# Patient Record
Sex: Male | Born: 1965
Health system: Southern US, Community
[De-identification: ages and names within clinical notes are randomized; demographics above are authoritative.]

## PROBLEM LIST (undated history)

## (undated) DIAGNOSIS — G4730 Sleep apnea, unspecified: Secondary | ICD-10-CM

## (undated) DIAGNOSIS — M1990 Unspecified osteoarthritis, unspecified site: Secondary | ICD-10-CM

## (undated) DIAGNOSIS — N189 Chronic kidney disease, unspecified: Secondary | ICD-10-CM

## (undated) DIAGNOSIS — E7800 Pure hypercholesterolemia, unspecified: Secondary | ICD-10-CM

## (undated) DIAGNOSIS — K219 Gastro-esophageal reflux disease without esophagitis: Secondary | ICD-10-CM

## (undated) DIAGNOSIS — I10 Essential (primary) hypertension: Secondary | ICD-10-CM

## (undated) DIAGNOSIS — F419 Anxiety disorder, unspecified: Secondary | ICD-10-CM

## (undated) HISTORY — PX: BACK SURGERY: SHX140

---

## 2005-12-19 HISTORY — PX: CYSTOSCOPY: SUR368

## 2006-07-01 ENCOUNTER — Ambulatory Visit: Payer: Self-pay | Admitting: General Practice

## 2006-07-19 ENCOUNTER — Ambulatory Visit: Payer: Self-pay | Admitting: General Practice

## 2007-11-07 DIAGNOSIS — G4730 Sleep apnea, unspecified: Secondary | ICD-10-CM | POA: Insufficient documentation

## 2008-07-21 ENCOUNTER — Ambulatory Visit: Payer: Self-pay | Admitting: Specialist

## 2010-06-04 ENCOUNTER — Ambulatory Visit: Payer: Self-pay | Admitting: Specialist

## 2012-06-01 ENCOUNTER — Ambulatory Visit: Payer: Self-pay | Admitting: General Practice

## 2012-06-01 ENCOUNTER — Emergency Department: Payer: Self-pay | Admitting: Emergency Medicine

## 2012-06-01 LAB — CBC
HCT: 45.6 % (ref 40.0–52.0)
HGB: 14.8 g/dL (ref 13.0–18.0)
MCH: 29.8 pg (ref 26.0–34.0)
MCHC: 32.4 g/dL (ref 32.0–36.0)
Platelet: 135 10*3/uL — ABNORMAL LOW (ref 150–440)
RBC: 4.97 10*6/uL (ref 4.40–5.90)
RDW: 14.8 % — ABNORMAL HIGH (ref 11.5–14.5)
WBC: 10.2 10*3/uL (ref 3.8–10.6)

## 2012-06-01 LAB — CK TOTAL AND CKMB (NOT AT ARMC): CK, Total: 181 U/L (ref 35–232)

## 2012-06-01 LAB — COMPREHENSIVE METABOLIC PANEL
Alkaline Phosphatase: 64 U/L (ref 50–136)
BUN: 15 mg/dL (ref 7–18)
Bilirubin,Total: 0.7 mg/dL (ref 0.2–1.0)
Calcium, Total: 8.8 mg/dL (ref 8.5–10.1)
EGFR (Non-African Amer.): >60
Glucose: 97 mg/dL (ref 65–99)
Osmolality: 284 (ref 275–301)
Potassium: 3.8 mmol/L (ref 3.5–5.1)
SGOT(AST): 18 U/L (ref 15–37)
SGPT (ALT): 31 U/L

## 2012-06-01 LAB — TROPONIN I
Troponin-I: <0.02 ng/mL
Troponin-I: <0.02 ng/mL

## 2012-09-08 ENCOUNTER — Emergency Department: Payer: Self-pay | Admitting: Unknown Physician Specialty

## 2012-09-08 LAB — CBC
HCT: 44.4 % (ref 40.0–52.0)
MCH: 30.3 pg (ref 26.0–34.0)
MCHC: 33.8 g/dL (ref 32.0–36.0)
MCV: 90 fL (ref 80–100)
Platelet: 164 10*3/uL (ref 150–440)
RBC: 4.95 10*6/uL (ref 4.40–5.90)
WBC: 10.4 10*3/uL (ref 3.8–10.6)

## 2012-09-08 LAB — URINALYSIS, COMPLETE
Bacteria: NONE SEEN
Bilirubin,UR: NEGATIVE
Glucose,UR: NEGATIVE mg/dL (ref 0–75)
Leukocyte Esterase: NEGATIVE
Nitrite: NEGATIVE
Ph: 6 (ref 4.5–8.0)
Specific Gravity: 1.024 (ref 1.003–1.030)
Squamous Epithelial: NONE SEEN
WBC UR: NONE SEEN /HPF (ref 0–5)

## 2012-09-08 LAB — BASIC METABOLIC PANEL
Anion Gap: 13 (ref 7–16)
BUN: 19 mg/dL — ABNORMAL HIGH (ref 7–18)
EGFR (African American): >60
EGFR (Non-African Amer.): >60

## 2012-09-08 LAB — HEPATIC FUNCTION PANEL A (ARMC)
Albumin: 4.1 g/dL (ref 3.4–5.0)
Alkaline Phosphatase: 57 U/L (ref 50–136)
Bilirubin, Direct: <0.1 mg/dL (ref 0.00–0.20)
Bilirubin,Total: 0.3 mg/dL (ref 0.2–1.0)
SGOT(AST): 20 U/L (ref 15–37)
Total Protein: 6.8 g/dL (ref 6.4–8.2)

## 2012-09-08 LAB — LIPASE, BLOOD: Lipase: 165 U/L (ref 73–393)

## 2012-10-30 ENCOUNTER — Emergency Department: Payer: Self-pay | Admitting: Emergency Medicine

## 2012-10-31 ENCOUNTER — Encounter (HOSPITAL_COMMUNITY): Payer: Self-pay | Admitting: *Deleted

## 2012-10-31 ENCOUNTER — Other Ambulatory Visit: Payer: Self-pay | Admitting: Urology

## 2012-10-31 LAB — CBC
HCT: 43.1 % (ref 40.0–52.0)
HGB: 15.5 g/dL (ref 13.0–18.0)
MCH: 32.1 pg (ref 26.0–34.0)
MCHC: 35.9 g/dL (ref 32.0–36.0)
RBC: 4.82 10*6/uL (ref 4.40–5.90)

## 2012-10-31 LAB — URINALYSIS, COMPLETE
Glucose,UR: NEGATIVE mg/dL (ref 0–75)
Leukocyte Esterase: NEGATIVE
Nitrite: NEGATIVE
Protein: NEGATIVE
RBC,UR: 96 /HPF (ref 0–5)
WBC UR: 2 /HPF (ref 0–5)

## 2012-10-31 LAB — BASIC METABOLIC PANEL
Anion Gap: 13 (ref 7–16)
BUN: 20 mg/dL — ABNORMAL HIGH (ref 7–18)
Chloride: 104 mmol/L (ref 98–107)
Creatinine: 0.94 mg/dL (ref 0.60–1.30)
EGFR (African American): >60
EGFR (Non-African Amer.): >60
Osmolality: 285 (ref 275–301)
Sodium: 140 mmol/L (ref 136–145)

## 2012-10-31 NOTE — Progress Notes (Addendum)
Called [facility] to find out what medications has there on 10/30/2012. Pt did have torodol 30 mg IV at 1145 on 10/30/12. Procedure is at 1345 on 11/01/12. This is more than 48 hours. Patient called and made aware that his procedure is still on. Pt instructed NPO after midnight. Reviewed need for laxative tonight and clear liquids afterwards. No aspirin or ibuprofen products. Pt and wife verbalize understanding.

## 2012-11-01 ENCOUNTER — Encounter (HOSPITAL_COMMUNITY)
Admission: RE | Disposition: A | Discharge status: Home / Self Care | Payer: Self-pay | Source: Ambulatory Visit | Attending: Urology

## 2012-11-01 ENCOUNTER — Ambulatory Visit (HOSPITAL_COMMUNITY): Payer: BC Managed Care – PPO

## 2012-11-01 ENCOUNTER — Ambulatory Visit (HOSPITAL_COMMUNITY)
Admission: RE | Admit: 2012-11-01 | Discharge: 2012-11-01 | Disposition: A | Discharge status: Home / Self Care | Payer: BC Managed Care – PPO | Source: Ambulatory Visit | Attending: Urology | Admitting: Urology

## 2012-11-01 DIAGNOSIS — N201 Calculus of ureter: Secondary | ICD-10-CM | POA: Insufficient documentation

## 2012-11-01 DIAGNOSIS — E785 Hyperlipidemia, unspecified: Secondary | ICD-10-CM | POA: Insufficient documentation

## 2012-11-01 DIAGNOSIS — G4730 Sleep apnea, unspecified: Secondary | ICD-10-CM | POA: Insufficient documentation

## 2012-11-01 DIAGNOSIS — I10 Essential (primary) hypertension: Secondary | ICD-10-CM | POA: Insufficient documentation

## 2012-11-01 HISTORY — DX: Anxiety disorder, unspecified: F41.9

## 2012-11-01 HISTORY — DX: Essential (primary) hypertension: I10

## 2012-11-01 HISTORY — DX: Pure hypercholesterolemia, unspecified: E78.00

## 2012-11-01 HISTORY — DX: Sleep apnea, unspecified: G47.30

## 2012-11-01 HISTORY — DX: Chronic kidney disease, unspecified: N18.9

## 2012-11-01 SURGERY — LITHOTRIPSY, ESWL
Anesthesia: LOCAL | Laterality: Left

## 2012-11-01 MED ORDER — SODIUM CHLORIDE 0.9 % IJ SOLN: 3.0000 mL | Freq: Two times a day (BID) | INTRAMUSCULAR | Status: DC

## 2012-11-01 MED ORDER — OXYCODONE-ACETAMINOPHEN 5-325 MG PO TABS: 1.0000 | ORAL_TABLET | Freq: Four times a day (QID) | ORAL | Status: DC | PRN

## 2012-11-01 MED ORDER — ACETAMINOPHEN 650 MG RE SUPP
650.0000 mg | RECTAL | Status: DC | PRN
  Filled 2012-11-01: qty 1

## 2012-11-01 MED ORDER — FENTANYL CITRATE 0.05 MG/ML IJ SOLN: 25.0000 ug | INTRAMUSCULAR | Status: DC | PRN

## 2012-11-01 MED ORDER — SODIUM CHLORIDE 0.9 % IV SOLN: 250.0000 mL | INTRAVENOUS | Status: DC | PRN

## 2012-11-01 MED ORDER — OXYCODONE-ACETAMINOPHEN 5-325 MG PO TABS: 2.0000 | ORAL_TABLET | Freq: Four times a day (QID) | ORAL | Status: DC | PRN

## 2012-11-01 MED ORDER — HYDROMORPHONE BOLUS VIA INFUSION
1.0000 mg | INTRAVENOUS | Status: DC | PRN
  Filled 2012-11-01: qty 1

## 2012-11-01 MED ORDER — OXYCODONE HCL 5 MG PO TABS
5.0000 mg | ORAL_TABLET | ORAL | Status: DC | PRN
  Administered 2012-11-01: 10 mg via ORAL
  Filled 2012-11-01: qty 2

## 2012-11-01 MED ORDER — CIPROFLOXACIN HCL 500 MG PO TABS
500.0000 mg | ORAL_TABLET | ORAL | Status: AC
  Administered 2012-11-01: 500 mg via ORAL
  Filled 2012-11-01: qty 1

## 2012-11-01 MED ORDER — HYDROMORPHONE HCL PF 1 MG/ML IJ SOLN
1.0000 mg | INTRAMUSCULAR | Status: DC | PRN
  Administered 2012-11-01: 1 mg via INTRAVENOUS
  Filled 2012-11-01: qty 1

## 2012-11-01 MED ORDER — DIAZEPAM 5 MG PO TABS
10.0000 mg | ORAL_TABLET | ORAL | Status: AC
  Administered 2012-11-01: 10 mg via ORAL
  Filled 2012-11-01: qty 2

## 2012-11-01 MED ORDER — SODIUM CHLORIDE 0.9 % IJ SOLN: 3.0000 mL | INTRAMUSCULAR | Status: DC | PRN

## 2012-11-01 MED ORDER — DIPHENHYDRAMINE HCL 25 MG PO CAPS
25.0000 mg | ORAL_CAPSULE | ORAL | Status: AC
  Administered 2012-11-01: 25 mg via ORAL
  Filled 2012-11-01: qty 1

## 2012-11-01 MED ORDER — DEXTROSE-NACL 5-0.45 % IV SOLN
INTRAVENOUS | Status: DC
  Administered 2012-11-01: 13:00:00 via INTRAVENOUS

## 2012-11-01 MED ORDER — ACETAMINOPHEN 325 MG PO TABS: 650.0000 mg | ORAL_TABLET | ORAL | Status: DC | PRN

## 2012-11-01 MED ORDER — ONDANSETRON HCL 4 MG/2ML IJ SOLN: 4.0000 mg | Freq: Four times a day (QID) | INTRAMUSCULAR | Status: DC | PRN

## 2012-11-01 NOTE — H&P (Signed)
ctive Problems Problems  1. Ureteral Stone Left 592.1  History of Present Illness  Nicholas Greene is a 46yo WM who was sent from [facility] for a left ureteral stone.  He was seen in the ER about 2 months ago and a CT showed a left ureteral and renal stone.  He  subsequently passed one stone, but had an episode of pain about 3 days ago.  The pain resolved with a pain pill, but last night the pain recurred and was severe.  He was seen in the ER where an US showed left hydronephrosis.   He has had to continue the pain med but hasn't had any nausea.  He has had no voiding complaints and no hematuria.  His UA had blood and CaOx crystals.  He had stones since he was 16 and he had ureteroscopy in 2007.   Past Medical History Problems  1. History of  Adult Sleep Apnea 780.57 2. History of  Hyperlipidemia 272.4 3. History of  Hypertension 401.9 4. History of  Nephrolithiasis V13.01  Surgical History Problems  1. History of  Cystoscopy With Ureteroscopy With Removal Of Calculus  Current Meds 1. Percocet TABS; Therapy: (Recorded:13Nov2013) to  Allergies Medication  1. Cortisone POWD  Family History Problems  1. Maternal history of  Death In The Family Father Died age 44 unknown causes 2. Maternal history of  Death In The Family Mother Died age 81-Lung cancer 3. Maternal history of  Nephrolithiasis  Social History Problems    Being A Social Drinker   Caffeine Use 4 per day   Marital History - Currently Married   Occupation: Merchandiser, retail   Tobacco Use 305.1 1 ppd for 30 yrs  Review of Systems Genitourinary, constitutional, skin, eye, otolaryngeal, hematologic/lymphatic, cardiovascular, pulmonary, endocrine, musculoskeletal, gastrointestinal, neurological and psychiatric system(s) were reviewed and pertinent findings if present are noted.  Genitourinary: erectile dysfunction.  Gastrointestinal: flank pain.    Vitals Vital Signs [Data Includes: Last 1 Day]  13Nov2013 01:52PM  BMI  Calculated: 41.79 BSA Calculated: 2.24 Height: 5 ft 6 in Weight: 260 lb  Blood Pressure: 108 / 70 Temperature: 97.1 F Heart Rate: 76  Physical Exam Constitutional: Well nourished and well developed . No acute distress.  ENT:. The ears and nose are normal in appearance.  Neck: The appearance of the neck is normal and no neck mass is present.  Pulmonary: No respiratory distress and normal respiratory rhythm and effort.  Cardiovascular: Heart rate and rhythm are normal . No peripheral edema.  Abdomen: The abdomen is obese. The abdomen is soft and nontender. No masses are palpated. No CVA tenderness. No hernias are palpable. No hepatosplenomegaly noted.  Lymphatics: The supraclavicular nodes are not enlarged or tender.  Skin: Normal skin turgor, no visible rash and no visible skin lesions.  Neuro/Psych:. Mood and affect are appropriate.    Results/Data Urine [Data Includes: Last 1 Day]   13Nov2013  COLOR YELLOW   APPEARANCE CLEAR   SPECIFIC GRAVITY 1.025   pH 5.5   GLUCOSE NEG mg/dL  BILIRUBIN NEG   KETONE NEG mg/dL  BLOOD TRACE   PROTEIN NEG mg/dL  UROBILINOGEN 0.2 mg/dL  NITRITE NEG   LEUKOCYTE ESTERASE NEG   SQUAMOUS EPITHELIAL/HPF FEW   WBC 0-2 WBC/hpf  RBC 0-2 RBC/hpf  BACTERIA NONE SEEN   CRYSTALS Calcium Oxalate crystals noted   CASTS NONE SEEN   Other MUCUS PRESENT    I have reviewed his ER notes, labs and renal US report from [facility] ER.  The  following images/tracing/specimen were independently visualized:  KUB today shows a 6x67mm stone at about L3. No other stones are noted. He has a left phlebolith. No other abnormalities are noted.    Assessment Assessed  1. Ureteral Stone Left 592.1   He has a 6x53mm left proximal stone with pain and obstruction.   Plan Health Maintenance (V70.0)  1. UA With REFLEX  Done: 13Nov2013 12:29PM Ureteral Stone (592.1)  2. KUB  Done: 13Nov2013 12:00AM 3. Follow-up Schedule Surgery Office  Follow-up  Requested for: 13Nov2013   I  reviewed the treatment options including ESWL and ureteroscopy.  He has had ureteroscopy before and would prefer not to go that route. I reviewed the risks of both procedures including bleeding, infection, obstructing fragments, need for secondary procedures, injury to the kidney or adjacent organs, thrombotic events and anesthetic risks.  He has sufficient pain med on hand. I will try to get him on for ESWL tomorrow.

## 2012-11-01 NOTE — Interval H&P Note (Signed)
History and Physical Interval Note:  11/01/2012 1:51 PM  Nicholas Greene  has presented today for surgery, with the diagnosis of left proximal stone  The various methods of treatment have been discussed with the patient and family. After consideration of risks, benefits and other options for treatment, the patient has consented to  Procedure(s) (LRB) with comments: EXTRACORPOREAL SHOCK WAVE LITHOTRIPSY (ESWL) (Left) as a surgical intervention .  The patient's history has been reviewed, patient examined, no change in status, stable for surgery.  I have reviewed the patient's chart and labs.  Questions were answered to the patient's satisfaction.     Breonia Kirstein J

## 2013-01-21 ENCOUNTER — Ambulatory Visit: Payer: Self-pay | Admitting: General Practice

## 2013-03-14 ENCOUNTER — Inpatient Hospital Stay: Payer: Self-pay | Admitting: Internal Medicine

## 2013-03-14 LAB — URINALYSIS, COMPLETE
Bilirubin,UR: NEGATIVE
Glucose,UR: 50 mg/dL (ref 0–75)
Ketone: NEGATIVE
Leukocyte Esterase: NEGATIVE
Protein: 30
RBC,UR: 1 /HPF (ref 0–5)
Specific Gravity: 1.019 (ref 1.003–1.030)

## 2013-03-14 LAB — DRUG SCREEN, URINE
Barbiturates, Ur Screen: NEGATIVE (ref ?–200)
Benzodiazepine, Ur Scrn: POSITIVE (ref ?–200)
Cannabinoid 50 Ng, Ur ~~LOC~~: NEGATIVE (ref ?–50)
MDMA (Ecstasy)Ur Screen: NEGATIVE (ref ?–500)
Methadone, Ur Screen: NEGATIVE (ref ?–300)
Opiate, Ur Screen: NEGATIVE (ref ?–300)

## 2013-03-14 LAB — CBC WITH DIFFERENTIAL/PLATELET
HCT: 46.2 % (ref 40.0–52.0)
HGB: 16 g/dL (ref 13.0–18.0)
Lymphocyte %: 32.6 %
MCHC: 34.5 g/dL (ref 32.0–36.0)
Neutrophil %: 66.3 %
Platelet: 107 10*3/uL — ABNORMAL LOW (ref 150–440)
RBC: 5.16 10*6/uL (ref 4.40–5.90)
RDW: 14.5 % (ref 11.5–14.5)
WBC: 2.5 10*3/uL — ABNORMAL LOW (ref 3.8–10.6)

## 2013-03-14 LAB — COMPREHENSIVE METABOLIC PANEL
Albumin: 3.8 g/dL (ref 3.4–5.0)
Anion Gap: 8 (ref 7–16)
Calcium, Total: 8.5 mg/dL (ref 8.5–10.1)
Chloride: 108 mmol/L — ABNORMAL HIGH (ref 98–107)
EGFR (African American): >60
SGPT (ALT): 81 U/L — ABNORMAL HIGH (ref 12–78)
Sodium: 141 mmol/L (ref 136–145)
Total Protein: 7 g/dL (ref 6.4–8.2)

## 2013-03-14 LAB — RAPID INFLUENZA A&B ANTIGENS

## 2013-03-15 LAB — CBC WITH DIFFERENTIAL/PLATELET
Eosinophil #: 0.3 10*3/uL (ref 0.0–0.7)
Lymphocyte #: 1.3 10*3/uL (ref 1.0–3.6)
Lymphocyte %: 21.3 %
MCHC: 35.4 g/dL (ref 32.0–36.0)
Monocyte %: 5.6 %
Neutrophil #: 4.1 10*3/uL (ref 1.4–6.5)
Neutrophil %: 67.7 %
RBC: 4.55 10*6/uL (ref 4.40–5.90)
RDW: 14.7 % — ABNORMAL HIGH (ref 11.5–14.5)

## 2013-03-15 LAB — BASIC METABOLIC PANEL
Anion Gap: 5 — ABNORMAL LOW (ref 7–16)
BUN: 15 mg/dL (ref 7–18)
Calcium, Total: 8.1 mg/dL — ABNORMAL LOW (ref 8.5–10.1)
Chloride: 107 mmol/L (ref 98–107)
Co2: 25 mmol/L (ref 21–32)
EGFR (Non-African Amer.): >60
Glucose: 208 mg/dL — ABNORMAL HIGH (ref 65–99)
Potassium: 3.9 mmol/L (ref 3.5–5.1)
Sodium: 137 mmol/L (ref 136–145)

## 2013-03-15 LAB — HEMOGLOBIN A1C: Hemoglobin A1C: 7.8 % — ABNORMAL HIGH (ref 4.2–6.3)

## 2013-03-15 LAB — TROPONIN I: Troponin-I: <0.02 ng/mL

## 2013-03-20 LAB — CULTURE, BLOOD (SINGLE)

## 2014-01-07 ENCOUNTER — Ambulatory Visit: Payer: Self-pay | Admitting: General Practice

## 2014-07-04 ENCOUNTER — Other Ambulatory Visit: Payer: Self-pay | Admitting: Neurosurgery

## 2014-07-04 DIAGNOSIS — M47816 Spondylosis without myelopathy or radiculopathy, lumbar region: Secondary | ICD-10-CM

## 2014-07-09 ENCOUNTER — Ambulatory Visit
Admission: RE | Admit: 2014-07-09 | Discharge: 2014-07-09 | Disposition: A | Discharge status: Home / Self Care | Payer: BC Managed Care – PPO | Source: Ambulatory Visit | Attending: Neurosurgery | Admitting: Neurosurgery

## 2014-07-09 VITALS — BP 123/69 | HR 66

## 2014-07-09 DIAGNOSIS — M47816 Spondylosis without myelopathy or radiculopathy, lumbar region: Secondary | ICD-10-CM

## 2014-07-09 MED ORDER — ONDANSETRON HCL 4 MG/2ML IJ SOLN
4.0000 mg | Freq: Once | INTRAMUSCULAR | Status: AC
Start: 2014-07-09 — End: 2014-07-09
  Administered 2014-07-09: 4 mg via INTRAMUSCULAR

## 2014-07-09 MED ORDER — HYDROMORPHONE HCL PF 2 MG/ML IJ SOLN
2.0000 mg | Freq: Once | INTRAMUSCULAR | Status: AC
  Administered 2014-07-09: 2 mg via INTRAMUSCULAR

## 2014-07-09 MED ORDER — DIAZEPAM 5 MG PO TABS
10.0000 mg | ORAL_TABLET | Freq: Once | ORAL | Status: AC
  Administered 2014-07-09: 10 mg via ORAL

## 2014-07-09 MED ORDER — IOHEXOL 180 MG/ML  SOLN
15.0000 mL | Freq: Once | INTRAMUSCULAR | Status: AC | PRN
  Administered 2014-07-09: 15 mL via INTRATHECAL

## 2014-07-09 NOTE — Discharge Instructions (Signed)

## 2014-07-25 ENCOUNTER — Other Ambulatory Visit: Payer: Self-pay | Admitting: Neurosurgery

## 2014-07-29 ENCOUNTER — Encounter (HOSPITAL_COMMUNITY): Payer: Self-pay | Admitting: Pharmacy Technician

## 2014-08-04 ENCOUNTER — Encounter (HOSPITAL_COMMUNITY): Payer: Self-pay

## 2014-08-04 ENCOUNTER — Encounter (HOSPITAL_COMMUNITY)
Admission: RE | Admit: 2014-08-04 | Discharge: 2014-08-04 | Disposition: A | Discharge status: Home / Self Care | Payer: BC Managed Care – PPO | Source: Ambulatory Visit | Attending: Neurosurgery | Admitting: Neurosurgery

## 2014-08-04 ENCOUNTER — Ambulatory Visit (HOSPITAL_COMMUNITY)
Admission: RE | Admit: 2014-08-04 | Discharge: 2014-08-04 | Disposition: A | Discharge status: Home / Self Care | Payer: BC Managed Care – PPO | Source: Ambulatory Visit | Attending: Anesthesiology | Admitting: Anesthesiology

## 2014-08-04 DIAGNOSIS — F17200 Nicotine dependence, unspecified, uncomplicated: Secondary | ICD-10-CM | POA: Diagnosis not present

## 2014-08-04 DIAGNOSIS — N189 Chronic kidney disease, unspecified: Secondary | ICD-10-CM | POA: Diagnosis not present

## 2014-08-04 DIAGNOSIS — E119 Type 2 diabetes mellitus without complications: Secondary | ICD-10-CM | POA: Insufficient documentation

## 2014-08-04 DIAGNOSIS — E785 Hyperlipidemia, unspecified: Secondary | ICD-10-CM | POA: Diagnosis not present

## 2014-08-04 DIAGNOSIS — I10 Essential (primary) hypertension: Secondary | ICD-10-CM | POA: Diagnosis not present

## 2014-08-04 DIAGNOSIS — K219 Gastro-esophageal reflux disease without esophagitis: Secondary | ICD-10-CM | POA: Diagnosis not present

## 2014-08-04 DIAGNOSIS — I129 Hypertensive chronic kidney disease with stage 1 through stage 4 chronic kidney disease, or unspecified chronic kidney disease: Secondary | ICD-10-CM | POA: Insufficient documentation

## 2014-08-04 DIAGNOSIS — Z01818 Encounter for other preprocedural examination: Secondary | ICD-10-CM | POA: Diagnosis not present

## 2014-08-04 HISTORY — DX: Gastro-esophageal reflux disease without esophagitis: K21.9

## 2014-08-04 LAB — CBC WITH DIFFERENTIAL/PLATELET
Basophils Absolute: 0 10*3/uL (ref 0.0–0.1)
Basophils Relative: 0 % (ref 0–1)
Eosinophils Absolute: 0.2 10*3/uL (ref 0.0–0.7)
Eosinophils Relative: 2 % (ref 0–5)
HEMATOCRIT: 46.3 % (ref 39.0–52.0)
Hemoglobin: 16.3 g/dL (ref 13.0–17.0)
LYMPHS ABS: 3.1 10*3/uL (ref 0.7–4.0)
Lymphocytes Relative: 37 % (ref 12–46)
MCH: 30.7 pg (ref 26.0–34.0)
MCHC: 35.2 g/dL (ref 30.0–36.0)
MCV: 87.2 fL (ref 78.0–100.0)
MONO ABS: 0.5 10*3/uL (ref 0.1–1.0)
MONOS PCT: 6 % (ref 3–12)
Neutro Abs: 4.5 10*3/uL (ref 1.7–7.7)
Neutrophils Relative %: 55 % (ref 43–77)
Platelets: 170 10*3/uL (ref 150–400)
RBC: 5.31 MIL/uL (ref 4.22–5.81)
RDW: 13.8 % (ref 11.5–15.5)
WBC: 8.3 10*3/uL (ref 4.0–10.5)

## 2014-08-04 LAB — BASIC METABOLIC PANEL
Anion gap: 16 — ABNORMAL HIGH (ref 5–15)
BUN: 13 mg/dL (ref 6–23)
CALCIUM: 9.9 mg/dL (ref 8.4–10.5)
CO2: 24 mEq/L (ref 19–32)
CREATININE: 0.74 mg/dL (ref 0.50–1.35)
Chloride: 102 mEq/L (ref 96–112)
GFR calc Af Amer: >90 mL/min (ref >90)
GLUCOSE: 253 mg/dL — AB (ref 70–99)
POTASSIUM: 5.5 meq/L — AB (ref 3.7–5.3)
Sodium: 142 mEq/L (ref 137–147)

## 2014-08-04 LAB — SURGICAL PCR SCREEN
MRSA, PCR: NEGATIVE
Staphylococcus aureus: NEGATIVE

## 2014-08-04 NOTE — Pre-Procedure Instructions (Signed)
[place].  08/04/2014   Your procedure is scheduled on:  Mon, Aug 24 @ 12:00 PM  Report to [facility]  at 10:00 AM.  Call this number if you have problems the morning of surgery: [phone]   Remember:   Do not eat food or drink liquids after midnight.   Take these medicines the morning of surgery with A SIP OF WATER: Valium(Diazepam)               No Goody's,BC's,Aleve,Aspirin,Ibuprofen,Fish Oil,or any Herbal Medications   Do not wear jewelry  Do not wear lotions, powders, or colognes. You may wear deodorant.  Men may shave face and neck.  Do not bring valuables to the hospital.  [facility] is not responsible                  for any belongings or valuables.               Contacts, dentures or bridgework may not be worn into surgery.  Leave suitcase in the car. After surgery it may be brought to your room.  For patients admitted to the hospital, discharge time is determined by your                treatment team.                 Special Instructions:  [place] - Preparing for Surgery  Before surgery, you can play an important role.  Because skin is not sterile, your skin needs to be as free of germs as possible.  You can reduce the number of germs on you skin by washing with CHG (chlorahexidine gluconate) soap before surgery.  CHG is an antiseptic cleaner which kills germs and bonds with the skin to continue killing germs even after washing.  Please DO NOT use if you have an allergy to CHG or antibacterial soaps.  If your skin becomes reddened/irritated stop using the CHG and inform your nurse when you arrive at Short Stay.  Do not shave (including legs and underarms) for at least 48 hours prior to the first CHG shower.  You may shave your face.  Please follow these instructions carefully:   1.  Shower with CHG Soap the night before surgery and the                                morning of Surgery.  2.  If you choose to wash your hair, wash your hair  first as usual with your       normal shampoo.  3.  After you shampoo, rinse your hair and body thoroughly to remove the                      Shampoo.  4.  Use CHG as you would any other liquid soap.  You can apply chg directly       to the skin and wash gently with scrungie or a clean washcloth.  5.  Apply the CHG Soap to your body ONLY FROM THE NECK DOWN.        Do not use on open wounds or open sores.  Avoid contact with your eyes,       ears, mouth and genitals (private parts).  Wash genitals (private parts)       with your normal soap.  6.  Wash thoroughly, paying special  attention to the area where your surgery        will be performed.  7.  Thoroughly rinse your body with warm water from the neck down.  8.  DO NOT shower/wash with your normal soap after using and rinsing off       the CHG Soap.  9.  Pat yourself dry with a clean towel.            10.  Wear clean pajamas.            11.  Place clean sheets on your bed the night of your first shower and do not        sleep with pets.  Day of Surgery  Do not apply any lotions/deoderants the morning of surgery.  Please wear clean clothes to the hospital/surgery center.     Please read over the following fact sheets that you were given: Pain Booklet, Coughing and Deep Breathing, Blood Transfusion Information, MRSA Information and Surgical Site Infection Prevention

## 2014-08-04 NOTE — Progress Notes (Addendum)
Patient has OSA, sleeps with CPAP, was tested 10-12 yrs ago over [place].  He thinks the settings are at 8 and he will bring the mask with him day of surgery. Does not want to wear the blood band x 7 days.  He understands he will have sample drawn DOS. Denies any heart problems or has never seen a cardiologist.

## 2014-08-05 NOTE — Progress Notes (Signed)
Anesthesia Chart Review:  Pt is 48 year old male posted for L5-S1 posterior lumbar interbody fusion.   PMH: HTN, diabetes, hyperlipidemia, GERD, OSA, chronic kidney disease. Current smoker. BMI 41.36  Preoperative labs reviewed. Blood glucose is 253. K is 5.5, likely due to hemolysis.   Cxr reviewed.   EKG NSR.   Will get DOS istat to recheck glucose in fasting state and K.   Willeen Cass, FNP-BC [facility]/Anesthesiology Phone: [phone] 08/05/2014 3:53 PM

## 2014-08-11 ENCOUNTER — Inpatient Hospital Stay (HOSPITAL_COMMUNITY): Payer: BC Managed Care – PPO | Admitting: Certified Registered"

## 2014-08-11 ENCOUNTER — Inpatient Hospital Stay (HOSPITAL_COMMUNITY): Payer: BC Managed Care – PPO

## 2014-08-11 ENCOUNTER — Encounter (HOSPITAL_COMMUNITY): Payer: BC Managed Care – PPO | Admitting: Emergency Medicine

## 2014-08-11 ENCOUNTER — Encounter (HOSPITAL_COMMUNITY)
Admission: RE | Disposition: A | Discharge status: Home / Self Care | Payer: Self-pay | Source: Ambulatory Visit | Attending: Neurosurgery

## 2014-08-11 ENCOUNTER — Inpatient Hospital Stay (HOSPITAL_COMMUNITY)
Admission: RE | Admit: 2014-08-11 | Discharge: 2014-08-13 | DRG: 460 | Disposition: A | Discharge status: Home / Self Care | Payer: BC Managed Care – PPO | Source: Ambulatory Visit | Attending: Neurosurgery | Admitting: Neurosurgery

## 2014-08-11 ENCOUNTER — Encounter (HOSPITAL_COMMUNITY): Payer: Self-pay | Admitting: Anesthesiology

## 2014-08-11 DIAGNOSIS — M47816 Spondylosis without myelopathy or radiculopathy, lumbar region: Secondary | ICD-10-CM | POA: Diagnosis present

## 2014-08-11 DIAGNOSIS — M47817 Spondylosis without myelopathy or radiculopathy, lumbosacral region: Principal | ICD-10-CM | POA: Diagnosis present

## 2014-08-11 DIAGNOSIS — N189 Chronic kidney disease, unspecified: Secondary | ICD-10-CM | POA: Diagnosis present

## 2014-08-11 DIAGNOSIS — F411 Generalized anxiety disorder: Secondary | ICD-10-CM | POA: Diagnosis present

## 2014-08-11 DIAGNOSIS — G4730 Sleep apnea, unspecified: Secondary | ICD-10-CM | POA: Diagnosis present

## 2014-08-11 DIAGNOSIS — I129 Hypertensive chronic kidney disease with stage 1 through stage 4 chronic kidney disease, or unspecified chronic kidney disease: Secondary | ICD-10-CM | POA: Diagnosis present

## 2014-08-11 DIAGNOSIS — M545 Low back pain, unspecified: Secondary | ICD-10-CM | POA: Diagnosis present

## 2014-08-11 DIAGNOSIS — F17200 Nicotine dependence, unspecified, uncomplicated: Secondary | ICD-10-CM | POA: Diagnosis present

## 2014-08-11 DIAGNOSIS — K219 Gastro-esophageal reflux disease without esophagitis: Secondary | ICD-10-CM | POA: Diagnosis present

## 2014-08-11 HISTORY — PX: MAXIMUM ACCESS (MAS)POSTERIOR LUMBAR INTERBODY FUSION (PLIF) 1 LEVEL: SHX6368

## 2014-08-11 LAB — POCT I-STAT 4, (NA,K, GLUC, HGB,HCT)
Glucose, Bld: 205 mg/dL — ABNORMAL HIGH (ref 70–99)
HCT: 47 % (ref 39.0–52.0)
HEMOGLOBIN: 16 g/dL (ref 13.0–17.0)
Potassium: 4.7 mEq/L (ref 3.7–5.3)
SODIUM: 137 meq/L (ref 137–147)

## 2014-08-11 LAB — ABO/RH: ABO/RH(D): O NEG

## 2014-08-11 LAB — TYPE AND SCREEN
ABO/RH(D): O NEG
ANTIBODY SCREEN: NEGATIVE

## 2014-08-11 SURGERY — FOR MAXIMUM ACCESS (MAS) POSTERIOR LUMBAR INTERBODY FUSION (PLIF) 1 LEVEL
Anesthesia: General

## 2014-08-11 MED ORDER — ONDANSETRON HCL 4 MG/2ML IJ SOLN
INTRAMUSCULAR | Status: AC
  Filled 2014-08-11: qty 2

## 2014-08-11 MED ORDER — HYDROMORPHONE HCL PF 1 MG/ML IJ SOLN: 0.2500 mg | INTRAMUSCULAR | Status: DC | PRN

## 2014-08-11 MED ORDER — ALUM & MAG HYDROXIDE-SIMETH 200-200-20 MG/5ML PO SUSP: 30.0000 mL | Freq: Four times a day (QID) | ORAL | Status: DC | PRN

## 2014-08-11 MED ORDER — HEMOSTATIC AGENTS (NO CHARGE) OPTIME
TOPICAL | Status: DC | PRN
  Administered 2014-08-11: 1 via TOPICAL

## 2014-08-11 MED ORDER — FENTANYL CITRATE 0.05 MG/ML IJ SOLN
INTRAMUSCULAR | Status: DC | PRN
  Administered 2014-08-11: 50 ug via INTRAVENOUS
  Administered 2014-08-11: 250 ug via INTRAVENOUS
  Administered 2014-08-11: 100 ug via INTRAVENOUS
  Administered 2014-08-11: 150 ug via INTRAVENOUS
  Administered 2014-08-11: 50 ug via INTRAVENOUS
  Administered 2014-08-11: 100 ug via INTRAVENOUS
  Administered 2014-08-11: 50 ug via INTRAVENOUS

## 2014-08-11 MED ORDER — FLEET ENEMA 7-19 GM/118ML RE ENEM
1.0000 | ENEMA | Freq: Once | RECTAL | Status: AC | PRN
  Filled 2014-08-11: qty 1

## 2014-08-11 MED ORDER — PHENOL 1.4 % MT LIQD: 1.0000 | OROMUCOSAL | Status: DC | PRN

## 2014-08-11 MED ORDER — PROPOFOL 10 MG/ML IV BOLUS
INTRAVENOUS | Status: AC
  Filled 2014-08-11: qty 20

## 2014-08-11 MED ORDER — ARTIFICIAL TEARS OP OINT
TOPICAL_OINTMENT | OPHTHALMIC | Status: AC
  Filled 2014-08-11: qty 3.5

## 2014-08-11 MED ORDER — FENTANYL CITRATE 0.05 MG/ML IJ SOLN
INTRAMUSCULAR | Status: AC
Start: 2014-08-11 — End: 2014-08-11
  Filled 2014-08-11: qty 5

## 2014-08-11 MED ORDER — FENTANYL CITRATE 0.05 MG/ML IJ SOLN
INTRAMUSCULAR | Status: AC
  Filled 2014-08-11: qty 5

## 2014-08-11 MED ORDER — LIDOCAINE HCL (CARDIAC) 20 MG/ML IV SOLN
INTRAVENOUS | Status: DC | PRN
  Administered 2014-08-11: 100 mg via INTRAVENOUS

## 2014-08-11 MED ORDER — SUCCINYLCHOLINE CHLORIDE 20 MG/ML IJ SOLN
INTRAMUSCULAR | Status: DC | PRN
  Administered 2014-08-11: 140 mg via INTRAVENOUS

## 2014-08-11 MED ORDER — METFORMIN HCL 500 MG PO TABS
1000.0000 mg | ORAL_TABLET | Freq: Every day | ORAL | Status: DC
  Administered 2014-08-12 – 2014-08-13 (×2): 1000 mg via ORAL
  Filled 2014-08-11 (×3): qty 2

## 2014-08-11 MED ORDER — POLYETHYLENE GLYCOL 3350 17 G PO PACK
17.0000 g | PACK | Freq: Every day | ORAL | Status: DC | PRN
  Filled 2014-08-11: qty 1

## 2014-08-11 MED ORDER — ACETAMINOPHEN 325 MG PO TABS: 650.0000 mg | ORAL_TABLET | ORAL | Status: DC | PRN

## 2014-08-11 MED ORDER — DIAZEPAM 5 MG PO TABS
5.0000 mg | ORAL_TABLET | Freq: Four times a day (QID) | ORAL | Status: DC | PRN
  Administered 2014-08-11 – 2014-08-13 (×6): 10 mg via ORAL
  Filled 2014-08-11 (×6): qty 2

## 2014-08-11 MED ORDER — MENTHOL 3 MG MT LOZG: 1.0000 | LOZENGE | OROMUCOSAL | Status: DC | PRN

## 2014-08-11 MED ORDER — OXYCODONE-ACETAMINOPHEN 5-325 MG PO TABS
1.0000 | ORAL_TABLET | ORAL | Status: DC | PRN
Start: 2014-08-11 — End: 2014-08-13
  Administered 2014-08-11 – 2014-08-12 (×3): 2 via ORAL
  Filled 2014-08-11 (×3): qty 2

## 2014-08-11 MED ORDER — BISOPROLOL FUMARATE 5 MG PO TABS
5.0000 mg | ORAL_TABLET | Freq: Every day | ORAL | Status: DC
  Administered 2014-08-12: 5 mg via ORAL
  Filled 2014-08-11 (×2): qty 1

## 2014-08-11 MED ORDER — THROMBIN 5000 UNITS EX SOLR
CUTANEOUS | Status: DC | PRN
  Administered 2014-08-11 (×2): 5000 [IU] via TOPICAL

## 2014-08-11 MED ORDER — NEOSTIGMINE METHYLSULFATE 10 MG/10ML IV SOLN
INTRAVENOUS | Status: AC
  Filled 2014-08-11: qty 1

## 2014-08-11 MED ORDER — ROCURONIUM BROMIDE 50 MG/5ML IV SOLN
INTRAVENOUS | Status: AC
  Filled 2014-08-11: qty 1

## 2014-08-11 MED ORDER — LIDOCAINE HCL (CARDIAC) 20 MG/ML IV SOLN
INTRAVENOUS | Status: AC
  Filled 2014-08-11: qty 10

## 2014-08-11 MED ORDER — SUCCINYLCHOLINE CHLORIDE 20 MG/ML IJ SOLN
INTRAMUSCULAR | Status: AC
  Filled 2014-08-11: qty 1

## 2014-08-11 MED ORDER — HYDROMORPHONE HCL PF 1 MG/ML IJ SOLN
INTRAMUSCULAR | Status: AC
  Filled 2014-08-11: qty 1

## 2014-08-11 MED ORDER — LIDOCAINE HCL 4 % MT SOLN
OROMUCOSAL | Status: DC | PRN
Start: 2014-08-11 — End: 2014-08-11
  Administered 2014-08-11: 4 mL via TOPICAL

## 2014-08-11 MED ORDER — ONDANSETRON HCL 4 MG/2ML IJ SOLN: 4.0000 mg | Freq: Once | INTRAMUSCULAR | Status: DC | PRN

## 2014-08-11 MED ORDER — VANCOMYCIN HCL IN DEXTROSE 1-5 GM/200ML-% IV SOLN
1000.0000 mg | Freq: Once | INTRAVENOUS | Status: AC
  Administered 2014-08-12: 1000 mg via INTRAVENOUS
  Filled 2014-08-11: qty 200

## 2014-08-11 MED ORDER — MIDAZOLAM HCL 2 MG/2ML IJ SOLN
INTRAMUSCULAR | Status: AC
  Filled 2014-08-11: qty 2

## 2014-08-11 MED ORDER — ARTIFICIAL TEARS OP OINT
TOPICAL_OINTMENT | OPHTHALMIC | Status: DC | PRN
  Administered 2014-08-11: 1 via OPHTHALMIC

## 2014-08-11 MED ORDER — VANCOMYCIN HCL IN DEXTROSE 1-5 GM/200ML-% IV SOLN
INTRAVENOUS | Status: AC
  Administered 2014-08-11: 1000 mg via INTRAVENOUS
  Filled 2014-08-11: qty 200

## 2014-08-11 MED ORDER — ONDANSETRON HCL 4 MG/2ML IJ SOLN
INTRAMUSCULAR | Status: DC | PRN
  Administered 2014-08-11 (×2): 4 mg via INTRAVENOUS

## 2014-08-11 MED ORDER — HYDROCODONE-ACETAMINOPHEN 5-325 MG PO TABS: 1.0000 | ORAL_TABLET | ORAL | Status: DC | PRN

## 2014-08-11 MED ORDER — METFORMIN HCL 500 MG PO TABS: 1000.0000 mg | ORAL_TABLET | Freq: Every day | ORAL | Status: DC

## 2014-08-11 MED ORDER — GLYCOPYRROLATE 0.2 MG/ML IJ SOLN
INTRAMUSCULAR | Status: AC
  Filled 2014-08-11: qty 2

## 2014-08-11 MED ORDER — SENNA 8.6 MG PO TABS
1.0000 | ORAL_TABLET | Freq: Two times a day (BID) | ORAL | Status: DC
  Administered 2014-08-11 – 2014-08-12 (×3): 8.6 mg via ORAL
  Filled 2014-08-11 (×6): qty 1

## 2014-08-11 MED ORDER — ACETAMINOPHEN 650 MG RE SUPP: 650.0000 mg | RECTAL | Status: DC | PRN

## 2014-08-11 MED ORDER — SODIUM CHLORIDE 0.9 % IJ SOLN
3.0000 mL | Freq: Two times a day (BID) | INTRAMUSCULAR | Status: DC
  Administered 2014-08-11 – 2014-08-12 (×3): 3 mL via INTRAVENOUS

## 2014-08-11 MED ORDER — LACTATED RINGERS IV SOLN: INTRAVENOUS | Status: DC

## 2014-08-11 MED ORDER — SODIUM CHLORIDE 0.9 % IJ SOLN: 3.0000 mL | INTRAMUSCULAR | Status: DC | PRN

## 2014-08-11 MED ORDER — 0.9 % SODIUM CHLORIDE (POUR BTL) OPTIME
TOPICAL | Status: DC | PRN
  Administered 2014-08-11: 1000 mL

## 2014-08-11 MED ORDER — ONDANSETRON HCL 4 MG/2ML IJ SOLN
4.0000 mg | INTRAMUSCULAR | Status: DC | PRN
Start: 2014-08-11 — End: 2014-08-13

## 2014-08-11 MED ORDER — LIDOCAINE HCL (CARDIAC) 20 MG/ML IV SOLN
INTRAVENOUS | Status: AC
  Filled 2014-08-11: qty 5

## 2014-08-11 MED ORDER — BISACODYL 10 MG RE SUPP: 10.0000 mg | Freq: Every day | RECTAL | Status: DC | PRN

## 2014-08-11 MED ORDER — PROPOFOL 10 MG/ML IV BOLUS
INTRAVENOUS | Status: DC | PRN
  Administered 2014-08-11: 200 mg via INTRAVENOUS
  Administered 2014-08-11: 100 mg via INTRAVENOUS

## 2014-08-11 MED ORDER — HYDROMORPHONE HCL PF 1 MG/ML IJ SOLN
0.5000 mg | INTRAMUSCULAR | Status: DC | PRN
  Administered 2014-08-11 – 2014-08-12 (×5): 1 mg via INTRAVENOUS
  Filled 2014-08-11 (×4): qty 1

## 2014-08-11 MED ORDER — SODIUM CHLORIDE 0.9 % IV SOLN: 250.0000 mL | INTRAVENOUS | Status: DC

## 2014-08-11 MED ORDER — PROPOFOL 10 MG/ML IV BOLUS
INTRAVENOUS | Status: AC
Start: 2014-08-11 — End: 2014-08-11
  Filled 2014-08-11: qty 20

## 2014-08-11 MED ORDER — BUPIVACAINE HCL (PF) 0.25 % IJ SOLN
INTRAMUSCULAR | Status: DC | PRN
  Administered 2014-08-11: 20 mL

## 2014-08-11 MED ORDER — LACTATED RINGERS IV SOLN
INTRAVENOUS | Status: DC | PRN
  Administered 2014-08-11 (×2): via INTRAVENOUS

## 2014-08-11 SURGICAL SUPPLY — 60 items
BAG DECANTER FOR FLEXI CONT (MISCELLANEOUS) ×2 IMPLANT
BENZOIN TINCTURE PRP APPL 2/3 (GAUZE/BANDAGES/DRESSINGS) ×2 IMPLANT
BLADE SURG ROTATE 9660 (MISCELLANEOUS) IMPLANT
BRUSH SCRUB EZ PLAIN DRY (MISCELLANEOUS) ×2 IMPLANT
CAGE COROENT MP 8X9X23M-8 SPIN (Cage) ×4 IMPLANT
CLIP NEUROVISION LG (CLIP) ×2 IMPLANT
CONT SPEC 4OZ CLIKSEAL STRL BL (MISCELLANEOUS) ×2 IMPLANT
COVER BACK TABLE 24X17X13 BIG (DRAPES) IMPLANT
COVER TABLE BACK 60X90 (DRAPES) ×2 IMPLANT
DERMABOND ADVANCED (GAUZE/BANDAGES/DRESSINGS) ×1
DERMABOND ADVANCED .7 DNX12 (GAUZE/BANDAGES/DRESSINGS) ×1 IMPLANT
DRAPE C-ARM 42X72 X-RAY (DRAPES) ×2 IMPLANT
DRAPE C-ARMOR (DRAPES) ×2 IMPLANT
DRAPE LAPAROTOMY 100X72X124 (DRAPES) ×2 IMPLANT
DRAPE SURG 17X23 STRL (DRAPES) ×8 IMPLANT
DRSG OPSITE POSTOP 4X6 (GAUZE/BANDAGES/DRESSINGS) ×2 IMPLANT
ELECT BLADE 4.0 EZ CLEAN MEGAD (MISCELLANEOUS)
ELECT REM PT RETURN 9FT ADLT (ELECTROSURGICAL) ×2
ELECTRODE BLDE 4.0 EZ CLN MEGD (MISCELLANEOUS) IMPLANT
ELECTRODE REM PT RTRN 9FT ADLT (ELECTROSURGICAL) ×1 IMPLANT
EVACUATOR 1/8 PVC DRAIN (DRAIN) IMPLANT
GAUZE SPONGE 4X4 12PLY STRL (GAUZE/BANDAGES/DRESSINGS) ×2 IMPLANT
GAUZE SPONGE 4X4 16PLY XRAY LF (GAUZE/BANDAGES/DRESSINGS) IMPLANT
GLOVE BIOGEL PI IND STRL 8.5 (GLOVE) ×1 IMPLANT
GLOVE BIOGEL PI INDICATOR 8.5 (GLOVE) ×1
GLOVE ECLIPSE 8.5 STRL (GLOVE) ×2 IMPLANT
GLOVE ECLIPSE 9.0 STRL (GLOVE) ×2 IMPLANT
GLOVE EXAM NITRILE LRG STRL (GLOVE) IMPLANT
GLOVE EXAM NITRILE MD LF STRL (GLOVE) ×2 IMPLANT
GLOVE EXAM NITRILE XL STR (GLOVE) IMPLANT
GLOVE EXAM NITRILE XS STR PU (GLOVE) IMPLANT
GOWN STRL REUS W/ TWL LRG LVL3 (GOWN DISPOSABLE) IMPLANT
GOWN STRL REUS W/ TWL XL LVL3 (GOWN DISPOSABLE) ×1 IMPLANT
GOWN STRL REUS W/TWL 2XL LVL3 (GOWN DISPOSABLE) IMPLANT
GOWN STRL REUS W/TWL LRG LVL3 (GOWN DISPOSABLE)
GOWN STRL REUS W/TWL XL LVL3 (GOWN DISPOSABLE) ×1
KIT BASIN OR (CUSTOM PROCEDURE TRAY) ×2 IMPLANT
KIT NEEDLE NVM5 EMG ELECT (KITS) ×1 IMPLANT
KIT NEEDLE NVM5 EMG ELECTRODE (KITS) ×1
KIT ROOM TURNOVER OR (KITS) ×2 IMPLANT
MILL MEDIUM DISP (BLADE) ×2 IMPLANT
NEEDLE HYPO 22GX1.5 SAFETY (NEEDLE) ×2 IMPLANT
NS IRRIG 1000ML POUR BTL (IV SOLUTION) ×2 IMPLANT
PACK LAMINECTOMY NEURO (CUSTOM PROCEDURE TRAY) ×2 IMPLANT
ROD PREBENT 45MM LUMBAR (Rod) ×4 IMPLANT
SCREW LOCK (Screw) ×4 IMPLANT
SCREW LOCK FXNS SPNE MAS PL (Screw) ×4 IMPLANT
SCREW SHANK 5.0X35 (Screw) ×4 IMPLANT
SCREW SHANK 6.5X30 (Screw) ×4 IMPLANT
SCREW TULIP 5.5 (Screw) ×8 IMPLANT
SPONGE LAP 4X18 X RAY DECT (DISPOSABLE) IMPLANT
SPONGE SURGIFOAM ABS GEL SZ50 (HEMOSTASIS) IMPLANT
STRIP CLOSURE SKIN 1/2X4 (GAUZE/BANDAGES/DRESSINGS) ×2 IMPLANT
SUT VIC AB 2-0 CT1 18 (SUTURE) ×2 IMPLANT
SUT VIC AB 3-0 SH 8-18 (SUTURE) ×2 IMPLANT
SYR 20ML ECCENTRIC (SYRINGE) ×2 IMPLANT
TOWEL OR 17X24 6PK STRL BLUE (TOWEL DISPOSABLE) ×2 IMPLANT
TOWEL OR 17X26 10 PK STRL BLUE (TOWEL DISPOSABLE) ×2 IMPLANT
TRAY FOLEY CATH 14FRSI W/METER (CATHETERS) IMPLANT
WATER STERILE IRR 1000ML POUR (IV SOLUTION) ×2 IMPLANT

## 2014-08-11 NOTE — Progress Notes (Signed)
Pt. Not responding to pain , no movement seen, Airway in place, Dr Ermalene Postin aware

## 2014-08-11 NOTE — Transfer of Care (Signed)
Immediate Anesthesia Transfer of Care Note  Patient: Nicholas Greene  Procedure(s) Performed: Procedure(s) with comments: FOR MAXIMUM ACCESS (MAS) POSTERIOR LUMBAR INTERBODY FUSION (PLIF) LUMBAR FIVE-SACRAL ONE (N/A) - FOR MAXIMUM ACCESS (MAS) POSTERIOR LUMBAR INTERBODY FUSION (PLIF) LUMBAR FIVE-SACRAL ONE  Patient Location: PACU  Anesthesia Type:General  Level of Consciousness: sedated  Airway & Oxygen Therapy: Patient Spontanous Breathing and Patient connected to face mask oxygen  Post-op Assessment: Report given to PACU RN and Post -op Vital signs reviewed and stable  Post vital signs: Reviewed and stable  Complications: No apparent anesthesia complications

## 2014-08-11 NOTE — Anesthesia Postprocedure Evaluation (Signed)
  Anesthesia Post-op Note  Patient: Nicholas Greene  Procedure(s) Performed: Procedure(s) with comments: FOR MAXIMUM ACCESS (MAS) POSTERIOR LUMBAR INTERBODY FUSION (PLIF) LUMBAR FIVE-SACRAL ONE (N/A) - FOR MAXIMUM ACCESS (MAS) POSTERIOR LUMBAR INTERBODY FUSION (PLIF) LUMBAR FIVE-SACRAL ONE  Patient Location: PACU  Anesthesia Type:General  Level of Consciousness: awake, alert , oriented and patient cooperative  Airway and Oxygen Therapy: Patient Spontanous Breathing  Post-op Pain: moderate  Post-op Assessment: Post-op Vital signs reviewed, Patient's Cardiovascular Status Stable, Respiratory Function Stable, Patent Airway and No signs of Nausea or vomiting  Post-op Vital Signs: stable  Last Vitals:  Filed Vitals:   08/11/14 1545  BP: 122/78  Pulse: 94  Temp:   Resp: 13    Complications: No apparent anesthesia complications

## 2014-08-11 NOTE — Op Note (Signed)
Date of procedure: 08/11/2014  Date of dictation: Same  Service: Neurosurgery  Preoperative diagnosis: L5-S1 spondylosis with stenosis and intractable back pain  Postoperative diagnosis: Same  Procedure Name: L5-S1 redo decompressive laminectomy with bilateral L5 and S1 decompressive foraminotomies; more than would be required for simple interbody fusion alone.  L5-S1 posterior lumbar interbody fusion utilizing interbody peek cage, locally harvested autograft  L5-S1 posterior lateral arthrodesis utilizing cortical pedicle screw fixation and locally harvested autograft.  Surgeon:Verdella Laidlaw A.Wilfred Dayrit, M.D.  Asst. Surgeon: Ellene Route  Anesthesia: General  Indication: 48 year old male status post previous left-sided L5-S1 laminotomy and discectomy for treatment of a severe lumbar radiculopathy or postoperatively patient's radicular pain is improved but he has continued severe lower back pain which is disabling and fails conservative management. Patient presents now for L5-S1 decompression and fusion.  Operative note: After induction of anesthesia, patient positioned prone onto Wilson frame and appropriately padded. Lumbar region prepped and draped. Incision made overlying L5-S1. Subperiosteal dissection performed bilaterally. Retractor placed. Utilizing intraoperative fluoroscopy and intraoperative neural monitoring injury sites 4 cortical pedicle screws L5 were obtained bilaterally. Pilot holes were drilled first with a traditional bur and then with the pilot drill tip itself. Once again this was done under continuous neural stimulation and monitoring. Each hole was probed and then tapped. 5.0 x 35 mm cortical pedicle screws were placed bilaterally at L5. Placement was confirmed in both AP and lateral fluoroscopic planes. Previous laminotomy site on the left dissected free and epidural scar was resected. Decompressive laminectomies then performed using Leksell rongeurs Kerrison rongeurs the high-speed drill  to remove the entire lamina of L5 and tiring inferior facet of L5 bilaterally. Superior facets of S1 bilaterally and the superior aspect of lamina of S1. Ligamentum flavum and epidural scar were elevated and resected. Underlying thecal sac and L5 and S1 nerve roots were identified. Very wide decompressive foraminotomies were performed on course exiting L5 and S1 nerve roots bilaterally much more than would be required for simple interbody fusion alone and and to affect adequate decompression of the symptomatic nerve roots. Bilateral discectomies and performed at L5-S1. The spaces distraction up to 8 mm. The spaces and reamed and cut with various chisels and curettes. Starting first on the right side an 8 mm x 8 nuvasive peek cage was packed with morselized autograft and packed into place and rotated in position. Distractor was removed. Thecal sac or respect on the contralateral side. Once again the space was prepared. Morselize autograft was packed into the interspace. A second cage was impacted into place and rotated into position. Pedicles of S1 were identified using surface landmarks and intraoperative fluoroscopy. Pilot holes were drilled. Pilot holes were tapped. Each pilot hole was stimulated and found to be free from any adjacent neural structures. 6.5 x 30 mm screws were placed bilaterally at S1. Transverse processes and lateral residual facet was decorticated. Morselize autograft packed posterior laterally. Short segment titanium rod was placed over the screws were locking caps and placed over the screws were locking caps and engaged with the construct under slight compression. Final images revealed good position of the cages and bone and instrumentation with normal lamina spine. Wounds that area there like solution. Gelfoam placed topically for hemostasis. Wounds and close in layers with Vicryl sutures. Steri-Strips sterile dressing were applied. No apparent complications. Patient tolerated the procedure  well and he returns to the recovery room postop.

## 2014-08-11 NOTE — Brief Op Note (Signed)
08/11/2014  3:15 PM  PATIENT:  Nicholas Greene.  48 y.o. male  PRE-OPERATIVE DIAGNOSIS:  spondylosis  POST-OPERATIVE DIAGNOSIS:  spondylosis  PROCEDURE:  Procedure(s): FOR MAXIMUM ACCESS (MAS) POSTERIOR LUMBAR INTERBODY FUSION (PLIF) 1 LEVEL (N/A)  SURGEON:  Surgeon(s) and Role:    * Charlie Pitter, MD - Primary    * Kristeen Miss, MD - Assisting  PHYSICIAN ASSISTANT:   ASSISTANTS:    ANESTHESIA:   general  EBL:  Total I/O In: 1500 [I.V.:1500] Out: 820 [Urine:120; Other:100; Blood:600]  BLOOD ADMINISTERED:none  DRAINS: none   LOCAL MEDICATIONS USED:  MARCAINE     SPECIMEN:  No Specimen  DISPOSITION OF SPECIMEN:  N/A  COUNTS:  YES  TOURNIQUET:  * No tourniquets in log *  DICTATION: .Dragon Dictation  PLAN OF CARE: Admit to inpatient   PATIENT DISPOSITION:  PACU - hemodynamically stable.   Delay start of Pharmacological VTE agent (>24hrs) due to surgical blood loss or risk of bleeding: yes

## 2014-08-11 NOTE — Anesthesia Preprocedure Evaluation (Addendum)
Anesthesia Evaluation  Patient identified by MRN, date of birth, ID band Patient awake    Reviewed: Allergy & Precautions, H&P , NPO status , Patient's Chart, lab work & pertinent test results, reviewed documented beta blocker date and time   Airway Mallampati: III TM Distance: >3 FB Neck ROM: Full    Dental  (+) Teeth Intact, Dental Advisory Given, Chipped,    Pulmonary sleep apnea , COPDCurrent Smoker,          Cardiovascular hypertension,     Neuro/Psych    GI/Hepatic GERD-  ,  Endo/Other  diabetes, Type 2, Oral Hypoglycemic Agents  Renal/GU Renal InsufficiencyRenal disease     Musculoskeletal   Abdominal (+) + obese,   Peds  Hematology   Anesthesia Other Findings   Reproductive/Obstetrics                          Anesthesia Physical Anesthesia Plan  ASA: III  Anesthesia Plan: General   Post-op Pain Management:    Induction: Intravenous  Airway Management Planned: Oral ETT  Additional Equipment:   Intra-op Plan:   Post-operative Plan: Extubation in OR  Informed Consent: I have reviewed the patients History and Physical, chart, labs and discussed the procedure including the risks, benefits and alternatives for the proposed anesthesia with the patient or authorized representative who has indicated his/her understanding and acceptance.     Plan Discussed with: CRNA, Anesthesiologist and Surgeon  Anesthesia Plan Comments:         Anesthesia Quick Evaluation

## 2014-08-11 NOTE — H&P (Signed)
Nicholas Greene. is an 48 y.o. male.   Chief Complaint: Back pain HPI: Patient is a 48 year old male who presented approximately 9 months ago with severe back and lower extremity pain. Workup demonstrated evidence of a calcified disc herniation L5-S1. Patient underwent laminotomy microdiscectomy at good improvement of his radicular pain however his back pain has persisted. Back pain is been intractable all nonoperative treatment methods including epidural steroids with physical therapy and time. Workup demonstrates evidence of marked disc space collapse with some residual spondylosis at L5-S1. There is no evidence of recurrent disc herniation. Remainder of his lumbar spine with quite healthy normal. Patient presents now for L5-S1 decompression infusion in hopes of improving his symptoms.  Past Medical History  Diagnosis Date  . Hypertension   . Anxiety   . Hypercholesteremia   . Chronic kidney disease     kidney stones since age 26  . Sleep apnea     cpap bring machine tested 10-12 yrs ago  . GERD (gastroesophageal reflux disease)     with tomatoe products    Past Surgical History  Procedure Laterality Date  . Cystoscopy  2007  . Back surgery      Jan 17 2014    No family history on file. Social History:  reports that he has been smoking Cigarettes.  He has a 30 pack-year smoking history. He does not have any smokeless tobacco history on file. He reports that he does not drink alcohol or use illicit drugs.  Allergies:  Allergies  Allergen Reactions  . Amoxicillin-Pot Clavulanate Anaphylaxis  . Clindamycin Other (See Comments)    Hallucinations   . Cortisone Acetate [Cortisone] Other (See Comments)    Shots caused redness and swelling at injection sites    Medications Prior to Admission  Medication Sig Dispense Refill  . bisoprolol (ZEBETA) 5 MG tablet Take 5 mg by mouth daily.      . cyclobenzaprine (FLEXERIL) 10 MG tablet Take 10 mg by mouth 3 (three) times daily as  needed for muscle spasms.      . diazepam (VALIUM) 5 MG tablet Take 5 mg by mouth every 6 (six) hours as needed for anxiety.      . metFORMIN (GLUCOPHAGE) 1000 MG tablet Take 1,000 mg by mouth daily with breakfast.      . oxycodone-acetaminophen (NARVOX) 10-500 MG per tablet Take 1 tablet by mouth every 6 (six) hours as needed for pain (and as needed).      . sildenafil (VIAGRA) 100 MG tablet Take 100 mg by mouth daily as needed for erectile dysfunction.         Results for orders placed during the hospital encounter of 08/11/14 (from the past 48 hour(s))  POCT I-STAT 4, (NA,K, GLUC, HGB,HCT)     Status: Abnormal   Collection Time    08/11/14 10:23 AM      Result Value Ref Range   Sodium 137  137 - 147 mEq/L   Potassium 4.7  3.7 - 5.3 mEq/L   Glucose, Bld 205 (*) 70 - 99 mg/dL   HCT 47.0  39.0 - 52.0 %   Hemoglobin 16.0  13.0 - 17.0 g/dL  TYPE AND SCREEN     Status: None   Collection Time    08/11/14 10:30 AM      Result Value Ref Range   ABO/RH(D) O NEG     Antibody Screen PENDING     Sample Expiration 08/14/2014     No results found.  A comprehensive review of systems was negative.  Blood pressure 116/72, pulse 84, temperature 97.9 F (36.6 C), temperature source Oral, resp. rate 20, height 5' 6"  (1.676 m), weight 116.121 kg (256 lb), SpO2 99.00%.  The patient is awake and alert. He is oriented and appropriate. Speech is fluent. Judgment and insight are intact. Cranial nerve function is intact. Motor examination intact bilaterally. Sensory examination reveals some mild decreased sensation over light touch in his L5 and S1 dermatomes bilaterally right greater than left. Straight leg raising is equivocal bilaterally. Marked lumbar pain and tenderness. Significant spasm. Wound well-healed. Reflexes normal. Examination head ears eyes shoulder is unremarkable. Chest and abdomen are benign. Neck is supple with midline airway. Carotid pulses are normal. Assessment/Plan L5-S1 spondylosis  with intractable back pain. Patient presents now for L5-S1 decompression and fusion. Risks and benefits have been explained. Patient wishes to proceed.  Perle Gibbon A 08/11/2014, 11:33 AM

## 2014-08-11 NOTE — Progress Notes (Signed)
Pt. Able to move both arms and both legs for Dr Ermalene Postin, pt still with airway

## 2014-08-11 NOTE — Progress Notes (Signed)
ANTIBIOTIC CONSULT NOTE - INITIAL  Pharmacy Consult for vancomycin Indication: surgical prophylaxis  Allergies  Allergen Reactions  . Amoxicillin-Pot Clavulanate Anaphylaxis  . Clindamycin Other (See Comments)    Hallucinations   . Cortisone Acetate [Cortisone] Other (See Comments)    Shots caused redness and swelling at injection sites    Patient Measurements: Height: 5' 6"  (167.6 cm) Weight: 256 lb (116.121 kg) IBW/kg (Calculated) : 63.8 Adjusted Body Weight:   Vital Signs: Temp: 97 F (36.1 C) (08/24 1730) Temp src: Oral (08/24 1013) BP: 119/68 mmHg (08/24 1730) Pulse Rate: 87 (08/24 1730) Intake/Output from previous day:   Intake/Output from this shift: Total I/O In: 1500 [I.V.:1500] Out: 1120 [Urine:420; Other:100; Blood:600]  Labs:  Recent Labs  08/11/14 1023  HGB 16.0   Estimated Creatinine Clearance: 139.7 ml/min (by C-G formula based on Cr of 0.74). No results found for this basename: VANCOTROUGH, Corlis Leak, VANCORANDOM, GENTTROUGH, GENTPEAK, GENTRANDOM, TOBRATROUGH, TOBRAPEAK, TOBRARND, AMIKACINPEAK, AMIKACINTROU, AMIKACIN,  in the last 72 hours   Microbiology: Recent Results (from the past 720 hour(s))  SURGICAL PCR SCREEN     Status: None   Collection Time    08/04/14  2:22 PM      Result Value Ref Range Status   MRSA, PCR NEGATIVE  NEGATIVE Final   Staphylococcus aureus NEGATIVE  NEGATIVE Final   Comment:            The Xpert SA Assay (FDA     approved for NASAL specimens     in patients over 2 years of age),     is one component of     a comprehensive surveillance     program.  Test performance has     been validated by [employer] for patients greater     than or equal to 46 year old.     It is not intended     to diagnose infection nor to     guide or monitor treatment.    Medical History: Past Medical History  Diagnosis Date  . Hypertension   . Anxiety   . Hypercholesteremia   . Chronic kidney disease     kidney stones  since age 31  . Sleep apnea     cpap bring machine tested 10-12 yrs ago  . GERD (gastroesophageal reflux disease)     with tomatoe products    Medications:  Anti-infectives   Start     Dose/Rate Route Frequency Ordered Stop   08/12/14 0030  vancomycin (VANCOCIN) IVPB 1000 mg/200 mL premix     1,000 mg 200 mL/hr over 60 Minutes Intravenous  Once 08/11/14 1812     08/11/14 1208  vancomycin (VANCOCIN) 1 GM/200ML IVPB    Comments:  Lorane Gell   : cabinet override      08/11/14 1208 08/11/14 1329     Assessment: 7 yom s/p L5-S1 redo decompressive laminectomy with L5 and S1 decompressive foraminotomies. To receive vancomycin post-op. No drain documented in the medical record and verified with RN. Pt will receive only 1 post-op dose.   Goal of Therapy:  Vancomycin trough level 10-15 mcg/ml  Plan:  1. Vancomycin 1gm x 1 given 12 hours post-op  Pharmacy will sign-off. Please re-consult [place] if antibiotics needs to be continued. Thanks!  Caulin Begley, Rande Lawman 08/11/2014,6:12 PM

## 2014-08-12 ENCOUNTER — Encounter (HOSPITAL_COMMUNITY): Payer: Self-pay | Admitting: Neurosurgery

## 2014-08-12 MED ORDER — OXYCODONE HCL 5 MG PO TABS
15.0000 mg | ORAL_TABLET | ORAL | Status: DC | PRN
  Administered 2014-08-12 – 2014-08-13 (×7): 30 mg via ORAL
  Filled 2014-08-12 (×7): qty 6

## 2014-08-12 MED ORDER — KETOROLAC TROMETHAMINE 30 MG/ML IJ SOLN
30.0000 mg | Freq: Four times a day (QID) | INTRAMUSCULAR | Status: DC
  Administered 2014-08-12 – 2014-08-13 (×4): 30 mg via INTRAVENOUS
  Filled 2014-08-12 (×7): qty 1

## 2014-08-12 NOTE — Evaluation (Signed)
Physical Therapy Evaluation Patient Details Name: Nicholas Greene. MRN: [id number] DOB: July 21, 1966 Today's Date: 08/12/2014   History of Present Illness  Patient is a 48 year old male who presented approximately 9 months ago with severe back and lower extremity pain. Workup demonstrated evidence of a calcified disc herniation L5-S1. Patient underwent laminotomy microdiscectomy at good improvement of his radicular pain however his back pain has persisted. Back pain is been intractable all nonoperative treatment methods including epidural steroids with physical therapy and time. Workup demonstrates evidence of marked disc space collapse. There is no evidence of recurrent disc herniation.  Patient now s/p L5-S1 fusion  Clinical Impression  Pt in chair on arrival stating he has been up for 4 hours in chair with only 1 walk during that time. Pt reports lying in the bed in any position is too painful despite premedication this AM. Pt educated for all back precautions with handout and teach back related to positioning, transfers, gait, brace wear and assist for mobility. Pt will benefit from acute therapy to maximize mobility, activity, gait and function to decrease pain and burden of care.     Follow Up Recommendations Home health PT;Supervision for mobility/OOB    Equipment Recommendations  3in1 (PT);Rolling walker with 5" wheels    Recommendations for Other Services OT consult     Precautions / Restrictions Precautions Precautions: Back Precaution Booklet Issued: Yes (comment) Required Braces or Orthoses: Spinal Brace Spinal Brace: Lumbar corset;Applied in sitting position      Mobility  Bed Mobility               General bed mobility comments: pt unable to attempt stating pain too severe in lying and has been sitting for 4 hours  Transfers Overall transfer level: Needs assistance   Transfers: Sit to/from Stand Sit to Stand: Mod assist         General transfer  comment: cues for hand placement, assist for reciprocal scooting and transfer to elevate from surface with max cueing, increased time  Ambulation/Gait Ambulation/Gait assistance: Min assist Ambulation Distance (Feet): 25 Feet Assistive device: Rolling walker (2 wheeled) Gait Pattern/deviations: Shuffle;Wide base of support   Gait velocity interpretation: Below normal speed for age/gender General Gait Details: heavy reliance on Rw with cues for posture and position in RW, limited by pain  Stairs            Wheelchair Mobility    Modified Rankin (Stroke Patients Only)       Balance Overall balance assessment: Needs assistance   Sitting balance-Leahy Scale: Fair       Standing balance-Leahy Scale: Poor                               Pertinent Vitals/Pain Pain Assessment: 0-10 Pain Score: 10-Worst pain ever Pain Location: back incision Pain Descriptors / Indicators: Burning Pain Intervention(s): Limited activity within patient's tolerance;Premedicated before session;Repositioned    Home Living Family/patient expects to be discharged to:: Private residence Living Arrangements: Spouse/significant other Available Help at Discharge: Family;Available 24 hours/day Type of Home: House Home Access: Stairs to enter Entrance Stairs-Rails: Left;Right;Can reach both Entrance Stairs-Number of Steps: 3 Home Layout: One level Home Equipment: None      Prior Function Level of Independence: Needs assistance   Gait / Transfers Assistance Needed: pt has been walking independently PTA but limited by distance  ADL's / Homemaking Assistance Needed: wife has been assisting with lower body bathing and dressing  for the last 9 months        Hand Dominance        Extremity/Trunk Assessment   Upper Extremity Assessment: Defer to OT evaluation           Lower Extremity Assessment: Generalized weakness      Cervical / Trunk Assessment: Kyphotic   Communication   Communication: No difficulties  Cognition Arousal/Alertness: Awake/alert Behavior During Therapy: WFL for tasks assessed/performed Overall Cognitive Status: Within Functional Limits for tasks assessed                      General Comments      Exercises        Assessment/Plan    PT Assessment Patient needs continued PT services  PT Diagnosis Generalized weakness;Acute pain;Difficulty walking   PT Problem List Decreased strength;Decreased activity tolerance;Decreased balance;Decreased mobility;Pain;Decreased knowledge of precautions;Decreased knowledge of use of DME  PT Treatment Interventions DME instruction;Gait training;Stair training;Functional mobility training;Therapeutic activities;Therapeutic exercise;Patient/family education   PT Goals (Current goals can be found in the Care Plan section) Acute Rehab PT Goals Patient Stated Goal: be able to walk without pain PT Goal Formulation: With patient Time For Goal Achievement: 08/26/14 Potential to Achieve Goals: Good    Frequency Min 5X/week   Barriers to discharge        Co-evaluation               End of Session Equipment Utilized During Treatment: Back brace Activity Tolerance: Patient limited by pain Patient left: in chair;with call bell/phone within reach;with family/visitor present Nurse Communication: Mobility status;Precautions;Patient requests pain meds         Time: 0822-0859 PT Time Calculation (min): 37 min   Charges:   PT Evaluation $Initial PT Evaluation Tier I: 1 Procedure PT Treatments $Therapeutic Activity: 23-37 mins   PT G Codes:          Melford Aase 08/12/2014, 9:08 AM Elwyn Reach, [place]

## 2014-08-12 NOTE — Progress Notes (Signed)
Inpatient Diabetes Program Recommendations  AACE/ADA: New Consensus Statement on Inpatient Glycemic Control (2013)  Target Ranges:  Prepandial:   less than 140 mg/dL      Peak postprandial:   less than 180 mg/dL (1-2 hours)      Critically ill patients:  140 - 180 mg/dL   Reason for Assessment:  Results for Nicholas Greene, Nicholas Greene (MRN [id number]) as of 08/12/2014 11:40  Ref. Range 08/04/2014 14:22 08/11/2014 10:23  Glucose Latest Range: 70-99 mg/dL 253 (H) 205 (H)   Diabetes history: Type 2 diabetes Outpatient Diabetes medications: Metformin 1000 mg daily Current orders for Inpatient glycemic control: Metformin 1000 mg daily  Please consider adding Novolog moderate correction tid with meals and HS while patient is in the hospital.  Also please change diet to CHO modified.  Thanks, Adah Perl, RN, BC-ADM Inpatient Diabetes Coordinator Pager [phone]

## 2014-08-12 NOTE — Progress Notes (Signed)
Postop day 1. Patient with a large amount of lumbar incisional pain. No radicular pain. No complaints of numbness or weakness. Patient has been able to stand and ambulate in the halls. Voiding well.  Afebrile. Vitals are stable. Dressing clean and dry. Motor and sensory exam intact. Chest and abdomen benign.  Progressing slowly following L5-S1 decompression and fusion. Will work to change medications to hopefully improve pain control.

## 2014-08-12 NOTE — Progress Notes (Signed)
Utilization review completed.  

## 2014-08-12 NOTE — Evaluation (Signed)
Occupational Therapy Evaluation Patient Details Name: Nicholas Greene MRN: [id number] DOB: September 30, 1966 Today's Date: 08/12/2014    History of Present Illness Patient is a 48 year old male who presented approximately 9 months ago with severe back and lower extremity pain. Workup demonstrated evidence of a calcified disc herniation L5-S1. Patient underwent laminotomy microdiscectomy at good improvement of his radicular pain however his back pain has persisted. Back pain is been intractable all nonoperative treatment methods including epidural steroids with physical therapy and time. Workup demonstrates evidence of marked disc space collapse. There is no evidence of recurrent disc herniation.  Patient now s/p L5-S1 fusion   Clinical Impression   Pt s/p above. Pt requiring assist from wife for ADLs, PTA. Feel pt will benefit from acute OT to increase independence prior to d/c.    Follow Up Recommendations  No OT follow up;Supervision - Intermittent    Equipment Recommendations  3 in 1 bedside comode    Recommendations for Other Services       Precautions / Restrictions Precautions Precautions: Back Precaution Comments: Reviewed precautions with pt Required Braces or Orthoses: Spinal Brace Spinal Brace: Lumbar corset;Applied in sitting position Restrictions Weight Bearing Restrictions: No      Mobility Bed Mobility Overal bed mobility: Needs Assistance Bed Mobility: Rolling;Sit to Sidelying Rolling: Mod assist       Sit to sidelying: Mod assist General bed mobility comments: assistance with LE's and when rolling to keep legs in alignment with body.  Transfers Overall transfer level: Needs assistance Equipment used: Rolling walker (2 wheeled) Transfers: Sit to/from Stand Sit to Stand: Mod assist         General transfer comment: educated on safe technique. assist to rise.    Balance                                            ADL Overall ADL's :  Needs assistance/impaired                 Upper Body Dressing : Sitting;Minimal assistance   Lower Body Dressing: Sit to/from stand;Minimal assistance;With adaptive equipment   Toilet Transfer: Min guard;Ambulation;RW (bed/chair)           Functional mobility during ADLs: Min guard;Rolling walker General ADL Comments: Discussed placement of grooming items to avoid breaking precautions. Educated on back brace and AE for LB ADLs. Educated on toilet aid for hygiene if pt would need it. Pt practiced with reacher/sockaid for sock.     Vision                     Perception     Praxis      Pertinent Vitals/Pain Pain Assessment: 0-10 Pain Score: 10-Worst pain ever Pain Location: back Pain Descriptors / Indicators: Burning Pain Intervention(s): Repositioned;Limited activity within patient's tolerance     Hand Dominance Right   Extremity/Trunk Assessment Upper Extremity Assessment Upper Extremity Assessment: Generalized weakness   Lower Extremity Assessment Lower Extremity Assessment: Defer to PT evaluation       Communication Communication Communication: No difficulties   Cognition Arousal/Alertness: Awake/alert Behavior During Therapy: WFL for tasks assessed/performed Overall Cognitive Status: Within Functional Limits for tasks assessed                     General Comments       Exercises  Shoulder Instructions      Home Living Family/patient expects to be discharged to:: Private residence Living Arrangements: Spouse/significant other Available Help at Discharge: Family;Available 24 hours/day Type of Home: House Home Access: Stairs to enter [employer] of Steps: 3 Entrance Stairs-Rails: Left;Right;Can reach both Home Layout: One level     Bathroom Shower/Tub: Teacher, early years/pre: Standard     Home Equipment: Financial controller: Reacher        Prior Functioning/Environment  Level of Independence: Needs assistance  Gait / Transfers Assistance Needed: pt has been walking independently PTA but limited by distance ADL's / Homemaking Assistance Needed: wife has been assisting with lower body bathing and dressing for the last 9 months        OT Diagnosis: Acute pain   OT Problem List: Pain;Decreased strength;Decreased range of motion;Decreased activity tolerance;Decreased knowledge of use of DME or AE;Decreased knowledge of precautions   OT Treatment/Interventions: Self-care/ADL training;DME and/or AE instruction;Therapeutic activities;Patient/family education;Balance training    OT Goals(Current goals can be found in the care plan section) Acute Rehab OT Goals Patient Stated Goal: not stated OT Goal Formulation: With patient Time For Goal Achievement: 08/19/14 Potential to Achieve Goals: Good ADL Goals Pt Will Perform Grooming: with modified independence;standing Pt Will Perform Lower Body Dressing: with modified independence;with adaptive equipment;sit to/from stand Pt Will Transfer to Toilet: with modified independence;ambulating (3 in 1 over commode) Pt Will Perform Toileting - Clothing Manipulation and hygiene: with modified independence;sit to/from stand Pt Will Perform Tub/Shower Transfer: Tub transfer;with supervision;ambulating;rolling walker (tub equipment tbd) Additional ADL Goal #1: Pt will independently verbalize 3/3 back precautions.  OT Frequency: Min 2X/week   Barriers to D/C:            Co-evaluation              End of Session Equipment Utilized During Treatment: Gait belt;Rolling walker;Back brace Nurse Communication: Patient requests pain meds;Mobility status  Activity Tolerance: Patient limited by pain Patient left: in bed;with call bell/phone within reach   Time: 4268-3419 OT Time Calculation (min): 26 min Charges:  OT General Charges $OT Visit: 1 Procedure OT Evaluation $Initial OT Evaluation Tier I: 1 Procedure OT  Treatments $Self Care/Home Management : 8-22 mins G-CodesBenito Mccreedy OTR/L [phone] 08/12/2014, 1:19 PM

## 2014-08-13 MED ORDER — DIAZEPAM 5 MG PO TABS: 5.0000 mg | ORAL_TABLET | Freq: Four times a day (QID) | ORAL | Status: DC | PRN

## 2014-08-13 MED ORDER — OXYCODONE HCL 15 MG PO TABS
15.0000 mg | ORAL_TABLET | ORAL | Status: DC | PRN
Start: 2014-08-13 — End: 2017-08-11

## 2014-08-13 NOTE — Progress Notes (Signed)
Physical Therapy Treatment Patient Details Name: Nicholas Greene MRN: [id number] DOB: [birth year] Today's Date: 08/13/2014    History of Present Illness Patient is a 48 year old male who presented approximately 9 months ago with severe back and lower extremity pain. Workup demonstrated evidence of a calcified disc herniation L5-S1. Patient underwent laminotomy microdiscectomy at good improvement of his radicular pain however his back pain has persisted. Back pain is been intractable all nonoperative treatment methods including epidural steroids with physical therapy and time. Workup demonstrates evidence of marked disc space collapse. There is no evidence of recurrent disc herniation.  Patient now s/p L5-S1 fusion    PT Comments    Patient demonstrates significant improvements in activity tolerance and mobility today. Patient ambulated without device, performed stair negotiation and was educated on expectations for mobility, positional changes, precautions, and pain management. No further acute PT needs at this time. Will sign off, patient in agreement.   Follow Up Recommendations  No PT follow up     Equipment Recommendations  None recommended by PT    Recommendations for Other Services       Precautions / Restrictions Precautions Precautions: Back Precaution Comments: Reviewed precautions with pt Required Braces or Orthoses: Spinal Brace Spinal Brace: Lumbar corset;Applied in sitting position Restrictions Weight Bearing Restrictions: No    Mobility  Bed Mobility Overal bed mobility: Modified Independent                Transfers Overall transfer level: Independent Equipment used: None Transfers: Sit to/from Stand Sit to Stand: Modified independent (Device/Increase time)         General transfer comment: No physical assist needed this session  Ambulation/Gait Ambulation/Gait assistance: Independent Ambulation Distance (Feet): 410 Feet Assistive device:  None Gait Pattern/deviations: Step-through pattern;Decreased stride length Gait velocity: decreased Gait velocity interpretation: Below normal speed for age/gender General Gait Details: significant improvement over previous session   Stairs Stairs: Yes Stairs assistance: Modified independent (Device/Increase time) Stair Management: One rail Right;Step to pattern;Forwards Number of Stairs: 12    Wheelchair Mobility    Modified Rankin (Stroke Patients Only)       Balance     Sitting balance-Leahy Scale: Good       Standing balance-Leahy Scale: Good                      Cognition Arousal/Alertness: Awake/alert Behavior During Therapy: WFL for tasks assessed/performed Overall Cognitive Status: Within Functional Limits for tasks assessed                      Exercises      General Comments        Pertinent Vitals/Pain Pain Assessment: 0-10 Pain Score: 4  Pain Location: back Pain Descriptors / Indicators: Constant Pain Intervention(s): Limited activity within patient's tolerance;Repositioned    Home Living                      Prior Function            PT Goals (current goals can now be found in the care plan section) Acute Rehab PT Goals Patient Stated Goal: not stated Progress towards PT goals: Goals met/education completed, patient discharged from PT    Frequency       PT Plan Discharge plan needs to be updated    Co-evaluation             End of Session Equipment Utilized During Treatment: Back brace Activity  Tolerance: Patient tolerated treatment well Patient left: in chair;with call bell/phone within reach;with family/visitor present     Time: 7579-7282 PT Time Calculation (min): 18 min  Charges:  $Gait Training: 8-22 mins                    G CodesDuncan Dull 2014/08/29, 11:23 AM Alben Deeds, PT DPT  [phone]

## 2014-08-13 NOTE — Discharge Instructions (Signed)
Wound Care °Keep incision covered and dry for two days.  If you shower, cover incision with plastic wrap.  °Do not put any creams, lotions, or ointments on incision. °Leave steri-strips on back.  They will fall off by themselves. °Activity °Walk each and every day, increasing distance each day. °No lifting greater than 5 lbs.  Avoid excessive neck motion. °No driving for 2 weeks; may ride as a passenger locally. °If provided with back brace, wear when out of bed.  It is not necessary to wear brace in bed. °Diet °Resume your normal diet.  °Return to Work °Will be discussed at you follow up appointment. °Call Your Doctor If Any of These Occur °Redness, drainage, or swelling at the wound.  °Temperature greater than 101 degrees. °Severe pain not relieved by pain medication. °Incision starts to come apart. °Follow Up Appt °Call today for appointment in 1-2 weeks ([phone]) or for problems.  If you have any hardware placed in your spine, you will need an x-ray before your appointment. ° ° °Spinal Fusion °Spinal fusion is a procedure to make 2 or more of the bones in your spinal column (vertebrae) grow together (fuse). This procedure stops movement between the vertebrae and can relieve pain and prevent deformity.  °Spinal fusion is used to treat the following conditions: °· Fractures of the spine. °· Herniated disk (the spongy material [cartilage] between the vertebrae). °· Abnormal curvatures of the spine, such as scoliosis or kyphosis. °· A weak or an unstable spine, caused by infections or tumor. °RISKS AND COMPLICATIONS °Complications associated with spinal fusion are rare, but they can occur. Possible complications include: °· Bleeding. °· Infection near the incision. °· Nerve damage. Signs of nerve damage are back pain, pain in one or both legs, weakness, or numbness. °· Spinal fluid leakage. °· Blood clot in your leg, which can move to your lungs. °· Difficulty controlling urination or bowel movements. °BEFORE THE  PROCEDURE °· A medical evaluation will be done. This will include a physical exam, blood tests, and imaging exams. °· You will talk with an anesthesiologist. This is the person who will be in charge of the anesthesia during the procedure. Spinal fusion usually requires that you are asleep during the procedure (general anesthesia). °· You will need to stop taking certain medicines, particularly those associated with an increased risk of bleeding. Ask your caregiver about changing or stopping your regular medicines. °· If you smoke, you will need to stop at least 2 weeks before the procedure. Smoking can slow down the healing process, especially fusion of the vertebrae, and increase the risk of complications. °· Do not eat or drink anything for at least 8 hours before the procedure. °PROCEDURE  °A cut (incision) is made over the vertebrae that will be fused. The back muscles are separated from the vertebrae. If you are having this procedure to treat a herniated disk, the disc material pressing on the nerve root is removed (decompression). The area where the disk is removed is then filled with extra bone. Bone from another part of your body (autogenous bone) or bone from a bone donor (allograft bone) may be used. The extra bone promotes fusion between the vertebrae. Sometimes, specific medicines are added to the fusion area to promote bone healing. In most cases, screws and rods or metal plates will be used to attach the vertebrae to stabilize them while they fuse.  °AFTER THE PROCEDURE  °· You will stay in a recovery area until the anesthesia has worn   off. Your blood pressure and pulse will be checked frequently. °· You will be given antibiotics to prevent infection. °· You may continue to receive fluids through an intravenous (IV) tube while you are still in the hospital. °· Pain after surgery is normal. You will be given pain medicine. °· You will be taught how to move correctly and how to stand and walk. While in  bed, you will be instructed to turn frequently, using a "log rolling" technique, in which the entire body is moved without twisting the back. °Document Released: 09/03/2003 Document Revised: 02/27/2012 Document Reviewed: 02/17/2011 °[employer]® Patient Information ©2015 [employer]. This information is not intended to replace advice given to you by your health care provider. Make sure you discuss any questions you have with your health care provider. ° °

## 2014-08-13 NOTE — Discharge Summary (Signed)
Physician Discharge Summary  Patient ID: Nicholas Greene MRN: [id number] DOB/AGE: [birth year] 48 y.o.  Admit date: 08/11/2014 Discharge date: 08/13/2014  Admission Diagnoses:  Discharge Diagnoses:  Principal Problem:   Lumbosacral spondylosis without myelopathy Active Problems:   Lumbar spondylosis   Discharged Condition: good  Hospital Course: The patient was admitted to the hospital where he underwent an uncomplicated T0-Y1 decompression and fusion. Postoperatively he is done reasonably well. Initially had a great deal of difficulty with incisional pain but this is gradually improved. Currently his pain is very well managed. He is up ambulating without difficulty. He has no motor or sensory complaints. Overall he feels improved from preop.  Consults:   Significant Diagnostic Studies:   Treatments:   Discharge Exam: Blood pressure 121/75, pulse 92, temperature 98.3 F (36.8 C), temperature source Oral, resp. rate 18, height 5' 6"  (1.676 m), weight 116.121 kg (256 lb), SpO2 94.00%.  awake and alert. Oriented and appropriate. Motor and sensory function intact. Wound clean and dry. Chest and abdomen benign.  Disposition: 01-Home or Self Care     Medication List    STOP taking these medications       oxycodone-acetaminophen 10-500 MG per tablet  Commonly known as:  NARVOX      TAKE these medications       bisoprolol 5 MG tablet  Commonly known as:  ZEBETA  Take 5 mg by mouth daily.     cyclobenzaprine 10 MG tablet  Commonly known as:  FLEXERIL  Take 10 mg by mouth 3 (three) times daily as needed for muscle spasms.     diazepam 5 MG tablet  Commonly known as:  VALIUM  Take 1-2 tablets (5-10 mg total) by mouth every 6 (six) hours as needed for muscle spasms.     metFORMIN 1000 MG tablet  Commonly known as:  GLUCOPHAGE  Take 1,000 mg by mouth daily with breakfast.     oxyCODONE 15 MG immediate release tablet  Commonly known as:  ROXICODONE  Take 1-2 tablets  (15-30 mg total) by mouth every 3 (three) hours as needed for severe pain.     sildenafil 100 MG tablet  Commonly known as:  VIAGRA  Take 100 mg by mouth daily as needed for erectile dysfunction.         Signed: Lilianah Buffin A 08/13/2014, 9:30 AM

## 2014-08-13 NOTE — Progress Notes (Signed)
Pt and wife given D/C instructions with Rx's, verbal understanding was provided. Pt's incision was covered with Honeycomb and had no sign of infection. Pt's IV was removed prior to D/C. Pt D/C'd home via walking per MD order @ 1140. Pt was stable @ D/C and had no other needs at this time. Holli Humbles

## 2014-08-13 NOTE — Progress Notes (Signed)
Pt. Has already placed himself on CPAP via FFM (what pt. Wears at home) Pt. Is tolerating CPAP well at this time without any complications.

## 2014-12-02 IMAGING — RF DG LUMBAR SPINE 2-3V
1 series · 2 of 2 positions shown · non-contrast
Comparison: CT scan of July 09, 2014.

CLINICAL DATA: Posterior fusion of L5-S1.

EXAM:
LUMBAR SPINE - 2-3 VIEW

[Series 1: run · 2 of 2 slices shown]
[im 1/2]
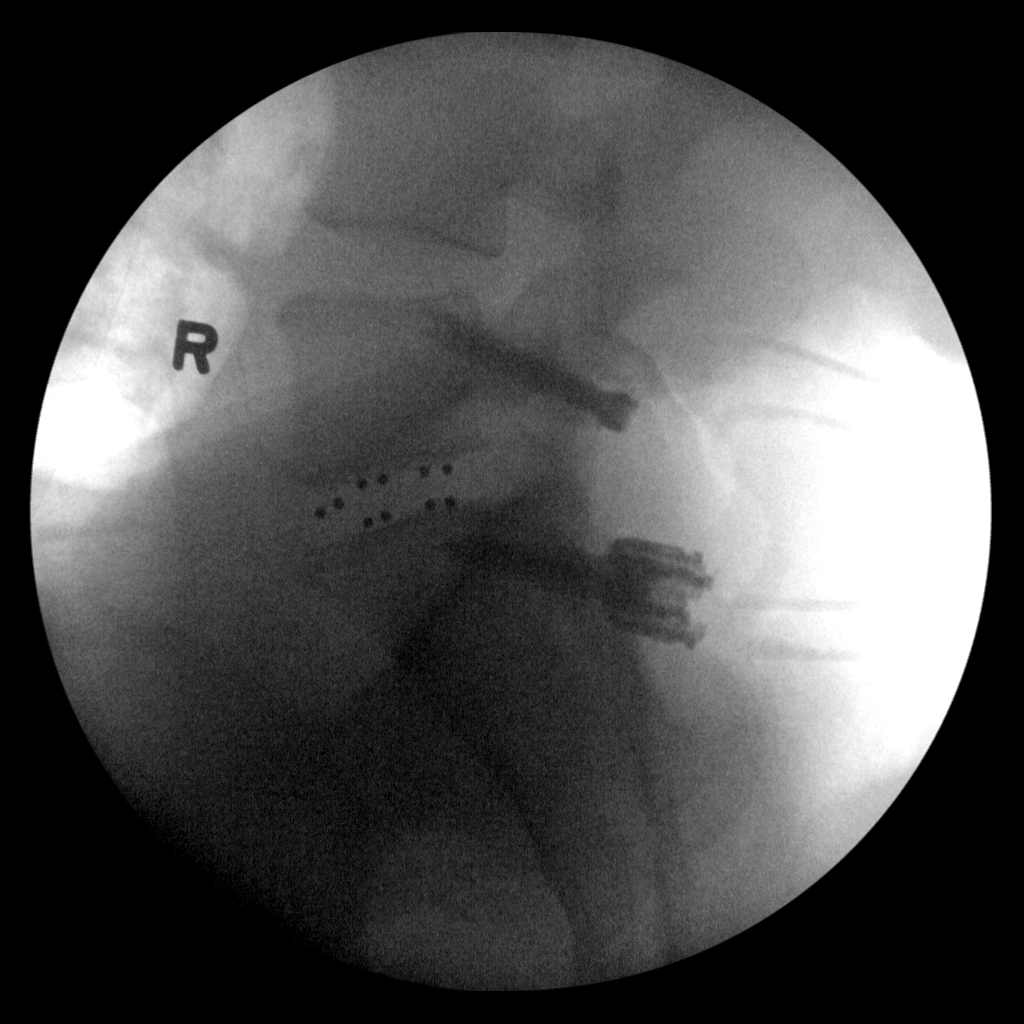
[im 2/2]
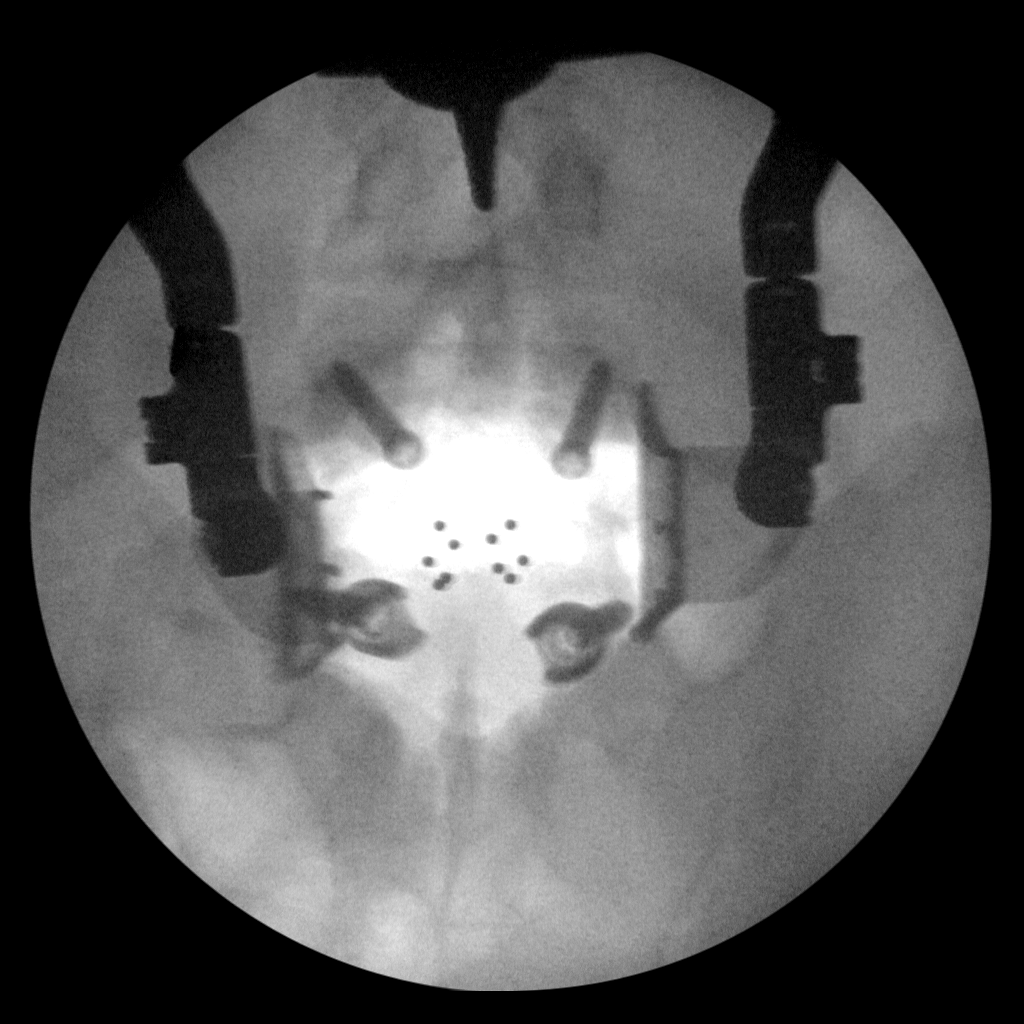

[2 of 2 positions shown; findings below may reference images not displayed]

FINDINGS: Two intraoperative fluoroscopic images were obtained of the lower
lumbar spine. These images demonstrate placement of bilateral
intrapedicular screws at L5 and S1. Interbody fusion device is noted
at L5-S1. Good alignment of vertebral bodies is noted.
IMPRESSION: Status post posterior fusion of L5-S1.

## 2015-04-10 NOTE — Discharge Summary (Signed)
PATIENT NAME:  Nicholas Greene, Nicholas Greene MR#:  [id number] DATE OF BIRTH:  1966/04/22  DATE OF ADMISSION:  03/14/2013 DATE OF DISCHARGE:  03/15/2013  PRESENTING COMPLAINT: Rash, hypotension, hypoxia and confusion.   DISCHARGE DIAGNOSES: 1.  Suspected drug reaction, most likely secondary to p.o. intake of clindamycin.  2.  History of hidradenitis suppurativa. 3.  Tobacco abuse. 4.  Hypertension.  5.  Hyperglycemia.   CODE STATUS: Full code.   MEDICATIONS: 1.  Bisoprolol 10 mg p.o. daily.  2.  Fenofibrate 160 mg p.o. daily.  3.  Benadryl 25 mg b.i.d. for 3 days and then as needed.   DIET: Low sodium.   FOLLOW UP:   1.  Follow up with Nicholas Greene, infectious disease in 1 week.  2.  Follow-up with your primary care physician, Nicholas Greene in 1 to 2 weeks.   CONSULTATION:  ID consultation with Nicholas Greene.   LABORATORY AND DIAGNOSTIC DATA:  Hemoglobin A1c is 7.8 and white count is 6.1, hemoglobin and hematocrit is 14.3 and 40.5, platelet count is 84, BUN is 15, creatinine 0.8, sodium 137, potassium 3.9, chloride 107, bicarbonate 25. PT-INR is within normal limits. Influenza A plus B negative. Blood cultures negative in 36 hours. CT head is no acute abnormality. ESR 9. Rickettsial fever antibodies are negative.   ANA comprehensive panel is negative.  Urine drug screen positive for benzos. CT chest is negative for PE. No definite aortic dissection, diffuse pulmonary ground glass attenuation may be related to lung volumes. Enlarged right hilar lymph nodes and borderline enlarged left hilar lymph nodes noted. Troponins negative. Lipase 223.   BRIEF SUMMARY OF HOSPITAL COURSE:  The patient is a 49 year old obese, Caucasian gentleman with history of hypertension, sleep apnea, comes in with:   1.  Suspected drug reaction likely secondary to clindamycin, cannot rule out any other cause at present given the presentation. The patient was initially admitted with sepsis, however, ruled out. He was  empirically started on vancomycin and meropenem, which was discontinued. The patient mainly came in after he took the gentamicin and rifampin for what he describes as skin lesions which are much suspicious for hidradenitis suppurative.  He was admitted on the Intensive Care Unit, did not require any IV pressors and received IV fluids. Benadryl was given for possible drug reaction. The patient was listed and now as allergic to CLINDAMYCIN.  Infectious disease consultation was obtained with Nicholas Greene who agrees with the above.  2.  Hypertension with early hypotension, now resolved. The patient will resume his blood pressure medications.  3.  Thrombocytopenia and neutropenia, likely due to drug reaction and is improving.  4.  Hyperglycemia on admission. The patient's hemoglobin A1c is now 7.8.  These results were obtained after the patient was discharged; hence, we will defer management per primary care physician, Nicholas Greene for diabetes. The patient will follow up with Nicholas Greene in 1 to 2 weeks and with Nicholas Greene is in 1 to 2 weeks to ensure resolving symptoms. The patient remained a full code.   TIME SPENT: 40 minutes.    ____________________________ Nicholas Rochester Posey Pronto, MD sap:ct D: 03/16/2013 12:44:42 ET T: 03/16/2013 13:49:28 ET JOB#: [id number]  cc: Nicholas Spadafore A. Posey Pronto, MD, <Dictator> Nicholas Royal Blocker, MD Nicholas Severe. Burt Ek, MD  Ilda Basset MD ELECTRONICALLY SIGNED 03/22/2013 15:45

## 2015-04-10 NOTE — Consult Note (Signed)
Impression:    49yo male w/ h/o recurrent inguinal skin lesions for which he takes intermittent Clindamycin admitted with hypotension, hypoxia, confusion and severe mylagias and arthralgias.    His skin lesions sound like Hydradenitis Supporitiva.  He has been on clinda intermittently for about a year or so.  He has never had problems with the medication in the past, but took a dose prior to the development of his symptoms.  It is surprising how sick he was yesterday and how dramatically improved he is today.  It is possible that this is a reaction to the medication.  It appears to be more of a serum sickness reaction.  He is currently feeling back to his baseline.   He does not appear to have meningitis.  Will d/c isolation.  No need for LP.   Despite having tolerated clinda in the past, I would consider him allergic to clinda.   I do not see any indication for antibiotics.     Would continue benadryl for a few more days.   I will see him as an outpatient next week.  Electronic Signatures: Keyosha Tiedt MPH, Heinz Knuckles (MD)  (Signed on 28-Mar-14 13:16)  Authored  Last Updated: 28-Mar-14 13:16 by Elyas Villamor MPH, Heinz Knuckles (MD)

## 2015-04-10 NOTE — Consult Note (Signed)
PATIENT NAME:  Nicholas Greene, Nicholas Greene MR#:  [id number] DATE OF BIRTH:  September 16, 1966  DATE OF CONSULTATION:  03/15/2013  REFERRING PHYSICIAN:  Dr. Max Sane CONSULTING PHYSICIAN:  Heinz Knuckles. Anniemae Haberkorn, MD  INFECTIOUS DISEASE VISIT  REASON FOR CONSULTATION:  Hypotension, arthralgias and myalgias with possible sepsis.   HISTORY OF PRESENT ILLNESS: The patient is a 49 year old white man with a past history significant for recurrent inguinal skin lesions for which he takes intermittent clindamycin who was admitted yesterday with acute onset of confusion, hypotension and hypoxia. The patient had been in his usual state of health. He works for the [employer] and was on his way to work when he began having significant confusion. He had taken a clindamycin tablet for inguinal lesions. In the past, he has had inguinal lesions when the weather was warm and was worried that he was going to break out again and so he had started the medication empirically, has not actually developed any skin lesions. He has taken the medicine intermittently in the past without problem. Shortly after taking the medicine, he began having confusion. He has difficulty describing the sensation he felt but realized that he did not feel well. He was brought to the Emergency Room and found to be hypoxic and hypotensive. He was also having severe arthralgias and myalgias. He denies any other new medications. He had no fevers, chills or sweats. He did not have a focal headache, although he did have pain in the back of his neck. He denies any specific neck stiffness but all of his joints were aching, including his neck. He had some shortness of breath, but no significant cough or sputum production. He did not have any rash other than an area over his abdomen which has subsequently resolved. He denies any hives. He has not had any wheezing. He was admitted to the CCU and initially started on broad-spectrum antibiotics with vancomycin and  meropenem. He also had blood cultures drawn that are negative to date. He received epinephrine and Benadryl on admission. He states that he continued to have severe muscle and joint pains until approximately 3:00 this morning when they all resolved. He currently feels completely well. He is no longer hypoxic and is no longer hypotensive. He states he is somewhat fatigued but otherwise feels at his baseline.   ALLERGIES:  INCLUDE AUGMENTIN.   PAST MEDICAL HISTORY: 1.  Skin lesions that occur in the groin. They have been recurrent.  2.  Hypotension.  3.  Sleep apnea.  4.  Nephrolithiasis.   SOCIAL HISTORY:  He works for the [employer]. He smokes a pack of cigarettes per day. He drinks alcohol occasionally. No injecting drug use history.   FAMILY HISTORY:  Positive for lung cancer in his mother and prostate cancer in his father.   REVIEW OF SYSTEMS:  GENERAL:  No fevers, chills or sweats. Positive fatigue and weakness.  HEENT:  No specific headaches. No sinus congestion. No sore throat. No nasal congestion. No dental pain.  NECK:  Some neck pain but no neck stiffness. No lymph node swelling.  CHEST:  Positive shortness of breath. No significant cough. No wheezing. No hemoptysis.  CARDIAC:  No chest pains or palpitations. No peripheral edema.  GASTROINTESTINAL:  No nausea. No vomiting. No abdominal pain. No change in his bowels.  GENITOURINARY:  No change in his urine.  MUSCULOSKELETAL:  He has significant myalgias and arthralgias, but no frank arthritis.  SKIN:  No current rashes. He states that  he had some rash in his abdomen yesterday, but this is resolved. No hives.  NEUROLOGIC:  The patient states that he was confused and just had a sensation of being very ill. No focal weakness, however.  PSYCHIATRIC:  No complaints.  All other systems are negative.   PHYSICAL EXAMINATION: VITAL SIGNS:  T-max of 98.5, T-current of 97.9, pulse 78, blood pressure 140/84, 95% on room air.   GENERAL:  A 49 year old white male in no acute distress.  HEENT: Normocephalic, atraumatic. Pupils equal, reactive to light. Extraocular motion intact. Sclerae, conjunctivae and lids are without evidence for emboli or petechiae. Oropharynx shows no erythema or exudate. Teeth and gums are in good condition.  NECK:  Supple. Full range of motion. Midline trachea. No lymphadenopathy. No thyromegaly.  LUNGS:  Clear to auscultation bilaterally with good air movement. No focal consolidation.  CARDIAC:  Regular rate and rhythm without murmur, rub or gallop.  ABDOMEN:  Soft, nontender, nondistended. No hepatosplenomegaly. No hernia is noted.  EXTREMITIES:  No evidence for tenosynovitis.  SKIN:  No rashes. No stigmata of endocarditis; specifically, no Janeway lesions or Osler nodes.   NEUROLOGIC:  The patient is awake and interactive, moving all 4 extremities.  PSYCHIATRIC:  Mood and affect appeared normal.   LABORATORY DATA:  BUN 15, creatinine 0.86, bicarbonate 25, anion gap of 5. Lipase was 223. AST was 49, ALT 81, alkaline phosphatase 81, total bilirubin 2.7. CPK was normal with 3 normal MBs and normal troponins. Urine tox screen was positive for benzodiazepines. White count today is 6.1 with a hemoglobin 14.3, platelet count of 84, ANC of 4.1. White count on admission was 2.5 with a hemoglobin of 16.0, platelet count of 107, ANC of 1.7. Sedimentation rate was 9 on admission. Blood cultures from admission show no growth. Rapid influenza test was negative. Urinalysis is unremarkable.  A chest x-ray showed no acute abnormalities.  A CT scan of the chest with contrast showed no pulmonary embolus. No aortic dissection. There was diffuse pulmonary ground-glass attenuation noted. There was an enlarged right hilar lymph node and borderline enlarged left hilar nodes.   IMPRESSION:  A 49 year old male with a history of recurrent inguinal skin lesions for which he takes intermittent clindamycin and was admitted  with hypotension, hypoxia, confusion and severe myalgias and arthralgias which have now all resolved.   RECOMMENDATIONS: 1.  The skin lesions sound like hidradenitis suppurativa. He has been on clindamycin intermittently for about a year or so. He has never had problems with the medication in the past, but took a dose prior to the development of his current symptoms. It is surprising how sick he was yesterday and how dramatically improved he is today. It is possible that this is a reaction to the medication. It appears to be more of a serum sickness reaction. He is currently feeling back to his baseline.  2.  He does not appear to have meningitis. Will discontinue isolation. There is no need for LP.  3.  Despite having tolerate clindamycin in the past, I would consider him allergic to clindamycin.  4.  I do not see any indication for antibiotics currently.  5.  Will continue Benadryl for a few more days.  6.  I will plan on seeing him as an outpatient next week.  7.  I will send an HIV PCR and an H1N1, although with his dramatic recovery, I think these are unlikely to be positive.  This is a high-level infectious disease case. Thank you very  much for involving me in the patient care.     ____________________________ Heinz Knuckles. Montgomery Rothlisberger, MD meb:ce D: 03/15/2013 13:34:18 ET T: 03/15/2013 14:32:10 ET JOB#: [id number]  cc: Heinz Knuckles. Jordanna Hendrie, MD, <Dictator> Shakita Keir E Analy Bassford MD ELECTRONICALLY SIGNED 03/20/2013 8:35

## 2015-04-10 NOTE — H&P (Signed)
PATIENT NAME:  Nicholas Greene, Nicholas Greene MR#:  [id number] DATE OF BIRTH:  1966-11-29  DATE OF ADMISSION:  03/14/2013  PRIMARY CARE PHYSICIAN: Nelda Severe. Burt Ek, MD  REQUESTING PHYSICIAN: Jon Gills. Lord, MD  CHIEF COMPLAINT: Body aches.   HISTORY OF PRESENT ILLNESS: The patient is a 49 year old male with a known history of hypertension and kidney stone. He is being admitted for suspected sepsis. The patient woke up around 5:15 to 5:30 this morning to go to work. Around 8:00 at work, he started having some mental status changes. He was unable to focus. He seemed to be confused, was unable to finish sentences. Due to confusion, the workers recommended he go to Dr. Skeet Simmer office. He was also found to have some rash on his upper chest and some on abdominal area. 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 Clindamycin and rifampin have been prescribed by his dermatologist last year considering his recurrent groin abscesses. He usually takes that in the summer, but considering his visit in possible woods today, he took both clindamycin and rifampin. While at Dr. Skeet Simmer office, he was to be hypoxic with room air oxygen saturation of 80%. He was placed on nonrebreather mask. EMS was called. He was looking very diaphoretic and pale and was having pain in the shoulder blades traveling to his back. EMS brought him down to the Emergency Department. While in the ED, he was tachypneic, tachycardiac and hypotensive with a blood pressure of 108/60 mmHg. ED physician felt this to be an allergic reaction from antibiotics and gave him EpiPen and was requested to be admitted for further evaluation and management.   On further talking with the patient, the patient had been taking this oral medication before, so none of them are new. He also reports having severe joint pains, mainly in the big joints (hip, back, shoulder and ankle) that started after he was  given EpiPen. He did have minimal joint pains at Dr. Skeet Simmer office, but now it is severe. His oxygen saturations are 92% now and he just does not feel right. He denies any fever at home.   PAST MEDICAL HISTORY:  1.  Hypertension. 2.  Sleep apnea.  3.  Kidney stone.   ALLERGIES: POSSIBLY TO ERYTHROMYCIN, although it seems to me that this was entered based on what ED physician felt today that he could have allergy to erythromycin. The patient never took erythromycin. He took clindamycin and that was also his old medication.   SOCIAL HISTORY: He smokes 1 pack of cigarettes daily for the last 20 to 25 years. Occasional alcohol. Denies any other IV drugs of abuse. He works for [employer].   FAMILY HISTORY: Positive for cancer, cancer of the lung in the mother and prostate cancer in the father.   MEDICATIONS AT HOME:  1.  Rifampin 300 mg 2 capsules p.o. daily. 2.  Fenofibrate 160 mg p.o. daily.  3.  Bisoprolol 10 mg p.o. daily.  4.  Augmentin 875 mg p.o. b.i.d. Although the patient's medication bottle was showing clindamycin, pharmacy is showing Augmentin when they called his pharmacy. The patient did take clindamycin as per his wife who is at the bedside.   REVIEW OF SYSTEMS:  CONSTITUTIONAL: Positive for fever, fatigue, weakness, and arthralgia and myalgia.  EYES: No blurred or double vision.  ENT: No tinnitus or ear pain.  RESPIRATORY: No cough, wheezing, hemoptysis. He did have hypoxia.  CARDIOVASCULAR: No chest  pain, orthopnea, edema.  GASTROINTESTINAL: No nausea, vomiting, diarrhea.  GENITOURINARY: No dysuria or hematuria.  ENDOCRINE: No polyuria or nocturia.  HEMATOLOGIC: Neutropenia and thrombocytopenia.  NEUROLOGIC: No tingling, numbness. Positive for weakness.  MUSCULOSKELETAL: He is having myalgia and arthralgia, mainly in the major joints (hip, back, shoulder and ankle).  SKIN: No obvious rash, lesion, ulcer. He does have minimal erythema on his chest and lower  abdominal area around periumbilical region.   PHYSICAL EXAMINATION:   VITAL SIGNS: Temperature 99, heart rate 105 per minute, respirations 22 per minute, blood pressure 105/68 mmHg. He was saturating 92% on room air.  GENERAL: Morbidly Obese male lying in bed in severe pain and requesting stronger pain medication.  HEENT: Eyes: Pupils are equal, round, reactive to light and accommodation. No scleral icterus. Extraocular muscles are intact. Head:  Atraumatic, normocephalic. Oropharynx and nasopharynx: Clear and dry.  NECK: Supple. No jugular venous distention. No thyroid enlargement or tenderness.   LUNGS: Clear to Auscultation, No Wheezing, Rales, rhonchi. CARDIOVASCULAR: S1, S2 normal. Tachycardiac.  No murmurs, rubs or gallops. ABDOMEN: Soft, Obese, nontender, nondistended. Bowel sounds present. No organomegaly or masses.  EXTREMITIES:  No pedal edema, cyanosis or clubbing. PSYCHIATRIC: The patient is oriented to time, place and person x 3.  SKIN: No obvious rash, lesion or ulcer. Minimal erythema on chest and abdomen. NEUROLOGIC: Cranial nerves II through XII intact. Muscle strength was unable to be evaluated, as he was having severe myalgia and would not cooperate with exam. Sensation seems intact. Gait not checked.   LABORATORY AND RADIOLOGICAL DATA: CBC within normal limits except white count of 2.5, platelets of 107. Urinalysis is negative. Troponin of less than 0.02. Normal BMP. Liver function tests showed total bilirubin of 2.7, AST 49, ALT 81.   Chest x-ray in the ED showed no acute cardiopulmonary disease.   CT scan of the chest with contrast showed no PE, no aortic dissection. Diffuse pulmonary ground-glass attenuation, possibly due to low lung volumes but cannot rule out interstitial lung disease or pulmonary edema. Enlarged right hilar lymph node and borderline enlarged left hilar lymph node.   IMPRESSION AND PLAN:  1.  Suspected sepsis with tachycardia, tachypnea, acute onset  confusion, hypotension, neutropenia, thrombocytopenia, and acute arthralgia and myalgia: Unknown source. Could be viral. Will empirically cover him with vancomycin and meropenem. Will get a CT scan of the head without contrast and possibly MRI of the brain in the morning to rule out any central nervous system pathology. Will consult infectious disease. Will order 3 sets of blood cultures along with Ehrlichia and Rickettsia antibodies. Will check influenza panel. Check ANA, ESR and CK to rule out any rheumatological disease. He may need lumbar puncture in the morning and I have ordered for under fluoroscopy. Discussed with Dr. Register who has agreed to do it. If he improves in next 24 hrs, MRI & LP may not be needed.  2.  Hypertension, with early on hypotension. Will monitor him in the Critical Care Unit closely for any acute decompensation.  3.  Thrombocytopenia and neutropenia: Likely due to sepsis. Will monitor closely.  4.  Tobacco abuse: He was counseled for about 3 minutes. He is agreeable to use nicotine patch, which I have ordered, and we will provide smoking cessation material.   TIME SPENT: Total time taking care of this patient (critical care): 55 minutes. He remains critically sick with sepsis and is at high risk for acute decompensation. Will monitor him in the CCU.    ____________________________  Magaby Rumberger S. Manuella Ghazi, MD vss:jm D: 03/14/2013 17:55:35 ET T: 03/14/2013 18:20:20 ET JOB#: [id number]  cc: Carinna Newhart S. Manuella Ghazi, MD, <Dictator> Nelda Severe. Burt Ek, MD Heinz Knuckles Blocker, MD Emmaline Kluver., MD [place] MD ELECTRONICALLY SIGNED 03/15/2013 14:10

## 2015-11-04 ENCOUNTER — Ambulatory Visit
Admission: RE | Admit: 2015-11-04 | Discharge: 2015-11-04 | Disposition: A | Discharge status: Home / Self Care | Payer: Disability Insurance | Source: Ambulatory Visit | Attending: Pediatrics | Admitting: Pediatrics

## 2015-11-04 ENCOUNTER — Other Ambulatory Visit: Payer: Self-pay | Admitting: Pediatrics

## 2015-11-04 DIAGNOSIS — Z9889 Other specified postprocedural states: Secondary | ICD-10-CM

## 2015-11-04 DIAGNOSIS — Z09 Encounter for follow-up examination after completed treatment for conditions other than malignant neoplasm: Secondary | ICD-10-CM | POA: Insufficient documentation

## 2015-11-04 DIAGNOSIS — N200 Calculus of kidney: Secondary | ICD-10-CM | POA: Insufficient documentation

## 2017-08-03 ENCOUNTER — Emergency Department: Payer: Self-pay

## 2017-08-03 ENCOUNTER — Encounter: Payer: Self-pay | Admitting: Emergency Medicine

## 2017-08-03 ENCOUNTER — Emergency Department
Admission: EM | Admit: 2017-08-03 | Discharge: 2017-08-03 | Disposition: A | Discharge status: Home / Self Care | Payer: Self-pay | Attending: Emergency Medicine | Admitting: Emergency Medicine

## 2017-08-03 DIAGNOSIS — I129 Hypertensive chronic kidney disease with stage 1 through stage 4 chronic kidney disease, or unspecified chronic kidney disease: Secondary | ICD-10-CM | POA: Insufficient documentation

## 2017-08-03 DIAGNOSIS — R109 Unspecified abdominal pain: Secondary | ICD-10-CM

## 2017-08-03 DIAGNOSIS — N200 Calculus of kidney: Secondary | ICD-10-CM | POA: Insufficient documentation

## 2017-08-03 DIAGNOSIS — F17210 Nicotine dependence, cigarettes, uncomplicated: Secondary | ICD-10-CM | POA: Insufficient documentation

## 2017-08-03 DIAGNOSIS — N189 Chronic kidney disease, unspecified: Secondary | ICD-10-CM | POA: Insufficient documentation

## 2017-08-03 DIAGNOSIS — R1032 Left lower quadrant pain: Secondary | ICD-10-CM | POA: Insufficient documentation

## 2017-08-03 LAB — URINALYSIS, COMPLETE (UACMP) WITH MICROSCOPIC
BACTERIA UA: NONE SEEN
BILIRUBIN URINE: NEGATIVE
Glucose, UA: NEGATIVE mg/dL
Ketones, ur: NEGATIVE mg/dL
Leukocytes, UA: NEGATIVE
NITRITE: NEGATIVE
PROTEIN: NEGATIVE mg/dL
Specific Gravity, Urine: 1.016 (ref 1.005–1.030)
pH: 6 (ref 5.0–8.0)

## 2017-08-03 LAB — CBC WITH DIFFERENTIAL/PLATELET
BASOS PCT: 1 %
Basophils Absolute: 0.1 10*3/uL (ref 0–0.1)
EOS ABS: 0.1 10*3/uL (ref 0–0.7)
EOS PCT: 1 %
HCT: 49.1 % (ref 40.0–52.0)
HEMOGLOBIN: 17 g/dL (ref 13.0–18.0)
LYMPHS ABS: 2.7 10*3/uL (ref 1.0–3.6)
LYMPHS PCT: 24 %
MCH: 29.6 pg (ref 26.0–34.0)
MCHC: 34.5 g/dL (ref 32.0–36.0)
MCV: 85.7 fL (ref 80.0–100.0)
MONOS PCT: 6 %
Monocytes Absolute: 0.7 10*3/uL (ref 0.2–1.0)
NEUTROS ABS: 7.7 10*3/uL — AB (ref 1.4–6.5)
NEUTROS PCT: 68 %
PLATELETS: 158 10*3/uL (ref 150–440)
RBC: 5.74 MIL/uL (ref 4.40–5.90)
RDW: 14.9 % — ABNORMAL HIGH (ref 11.5–14.5)
WBC: 11.3 10*3/uL — ABNORMAL HIGH (ref 3.8–10.6)

## 2017-08-03 LAB — COMPREHENSIVE METABOLIC PANEL
ALT: 42 U/L (ref 17–63)
ANION GAP: 6 (ref 5–15)
AST: 27 U/L (ref 15–41)
Albumin: 4.6 g/dL (ref 3.5–5.0)
Alkaline Phosphatase: 70 U/L (ref 38–126)
BUN: 14 mg/dL (ref 6–20)
CHLORIDE: 109 mmol/L (ref 101–111)
CO2: 25 mmol/L (ref 22–32)
Calcium: 9.4 mg/dL (ref 8.9–10.3)
Creatinine, Ser: 1.05 mg/dL (ref 0.61–1.24)
GFR calc non Af Amer: >60 mL/min (ref >60)
Glucose, Bld: 137 mg/dL — ABNORMAL HIGH (ref 65–99)
Potassium: 4.2 mmol/L (ref 3.5–5.1)
SODIUM: 140 mmol/L (ref 135–145)
Total Bilirubin: 0.7 mg/dL (ref 0.3–1.2)
Total Protein: 7.4 g/dL (ref 6.5–8.1)

## 2017-08-03 MED ORDER — MORPHINE SULFATE (PF) 4 MG/ML IV SOLN
8.0000 mg | Freq: Once | INTRAVENOUS | Status: AC
  Administered 2017-08-03: 8 mg via INTRAVENOUS

## 2017-08-03 MED ORDER — MORPHINE SULFATE (PF) 4 MG/ML IV SOLN
4.0000 mg | Freq: Once | INTRAVENOUS | Status: AC
  Administered 2017-08-03: 4 mg via INTRAVENOUS

## 2017-08-03 MED ORDER — ONDANSETRON HCL 4 MG/2ML IJ SOLN
4.0000 mg | Freq: Once | INTRAMUSCULAR | Status: AC
  Administered 2017-08-03: 4 mg via INTRAVENOUS

## 2017-08-03 MED ORDER — TAMSULOSIN HCL 0.4 MG PO CAPS: 0.4000 mg | ORAL_CAPSULE | Freq: Every day | ORAL | 0 refills | Status: DC

## 2017-08-03 MED ORDER — MORPHINE SULFATE (PF) 4 MG/ML IV SOLN: 4.0000 mg | Freq: Once | INTRAVENOUS | Status: DC

## 2017-08-03 MED ORDER — KETOROLAC TROMETHAMINE 30 MG/ML IJ SOLN
15.0000 mg | Freq: Once | INTRAMUSCULAR | Status: AC
  Administered 2017-08-03: 15 mg via INTRAVENOUS

## 2017-08-03 NOTE — ED Triage Notes (Signed)
Pt reports severe left flank pain with nausea that began last night. Denies blood in urine. Pt in obvious discomfort in triage.

## 2017-08-03 NOTE — Discharge Instructions (Signed)
Please make an appointment to follow up with urology this coming Monday for repeat evaluation. Return to the emergency department sooner for any new or worsening symptoms such as worsening pain, fevers, chills, or for any other issues whatsoever.  It was a pleasure to take care of you today, and thank you for coming to our emergency department.  If you have any questions or concerns before leaving please ask the nurse to grab me and I'm more than happy to go through your aftercare instructions again.  If you were prescribed any opioid pain medication today such as Norco, Vicodin, Percocet, morphine, hydrocodone, or oxycodone please make sure you do not drive when you are taking this medication as it can alter your ability to drive safely.  If you have any concerns once you are home that you are not improving or are in fact getting worse before you can make it to your follow-up appointment, please do not hesitate to call 911 and come back for further evaluation.  Darel Hong, MD  Results for orders placed or performed during the hospital encounter of 08/03/17  Urinalysis, Complete w Microscopic  Result Value Ref Range   Color, Urine YELLOW (A) YELLOW   APPearance CLEAR (A) CLEAR   Specific Gravity, Urine 1.016 1.005 - 1.030   pH 6.0 5.0 - 8.0   Glucose, UA NEGATIVE NEGATIVE mg/dL   Hgb urine dipstick LARGE (A) NEGATIVE   Bilirubin Urine NEGATIVE NEGATIVE   Ketones, ur NEGATIVE NEGATIVE mg/dL   Protein, ur NEGATIVE NEGATIVE mg/dL   Nitrite NEGATIVE NEGATIVE   Leukocytes, UA NEGATIVE NEGATIVE   RBC / HPF TOO NUMEROUS TO COUNT 0 - 5 RBC/hpf   WBC, UA 0-5 0 - 5 WBC/hpf   Bacteria, UA NONE SEEN NONE SEEN   Squamous Epithelial / LPF 0-5 (A) NONE SEEN   Mucous PRESENT   Comprehensive metabolic panel  Result Value Ref Range   Sodium 140 135 - 145 mmol/L   Potassium 4.2 3.5 - 5.1 mmol/L   Chloride 109 101 - 111 mmol/L   CO2 25 22 - 32 mmol/L   Glucose, Bld 137 (H) 65 - 99 mg/dL   BUN 14  6 - 20 mg/dL   Creatinine, Ser 1.05 0.61 - 1.24 mg/dL   Calcium 9.4 8.9 - 10.3 mg/dL   Total Protein 7.4 6.5 - 8.1 g/dL   Albumin 4.6 3.5 - 5.0 g/dL   AST 27 15 - 41 U/L   ALT 42 17 - 63 U/L   Alkaline Phosphatase 70 38 - 126 U/L   Total Bilirubin 0.7 0.3 - 1.2 mg/dL   GFR calc non Af Amer >60 >60 mL/min   GFR calc Af Amer >60 >60 mL/min   Anion gap 6 5 - 15  CBC with Differential  Result Value Ref Range   WBC 11.3 (H) 3.8 - 10.6 K/uL   RBC 5.74 4.40 - 5.90 MIL/uL   Hemoglobin 17.0 13.0 - 18.0 g/dL   HCT 49.1 40.0 - 52.0 %   MCV 85.7 80.0 - 100.0 fL   MCH 29.6 26.0 - 34.0 pg   MCHC 34.5 32.0 - 36.0 g/dL   RDW 14.9 (H) 11.5 - 14.5 %   Platelets 158 150 - 440 K/uL   Neutrophils Relative % 68 %   Neutro Abs 7.7 (H) 1.4 - 6.5 K/uL   Lymphocytes Relative 24 %   Lymphs Abs 2.7 1.0 - 3.6 K/uL   Monocytes Relative 6 %   Monocytes Absolute 0.7  0.2 - 1.0 K/uL   Eosinophils Relative 1 %   Eosinophils Absolute 0.1 0 - 0.7 K/uL   Basophils Relative 1 %   Basophils Absolute 0.1 0 - 0.1 K/uL   Ct Renal Stone Study  Result Date: 08/03/2017 CLINICAL DATA:  Severe left flank pain and nausea since last night. History of nephrolithiasis. EXAM: CT ABDOMEN AND PELVIS WITHOUT CONTRAST TECHNIQUE: Multidetector CT imaging of the abdomen and pelvis was performed following the standard protocol without IV contrast. COMPARISON:  09/08/2012 CT abdomen/pelvis. FINDINGS: Lower chest: No significant pulmonary nodules or acute consolidative airspace disease. Hepatobiliary: Normal liver with no liver mass. Normal gallbladder with no radiopaque cholelithiasis. No biliary ductal dilatation. Pancreas: Normal, with no mass or duct dilation. Spleen: Normal size. No mass. Adrenals/Urinary Tract: Normal adrenals. Nonobstructing 2 mm interpolar right renal stone. No right hydronephrosis. Obstructing 6 x 4 mm left lumbar ureteral stone at the L4 level with moderate left hydroureteronephrosis and asymmetric left perinephric  fat stranding. No additional stones in the left renal collecting system or left ureter. Normal caliber right ureter with no right ureteral stones. Simple 1.8 cm renal cyst in the posterior upper left kidney. No additional contour deforming renal lesions. Normal bladder. Stomach/Bowel: Grossly normal stomach. Normal caliber small bowel with no small bowel wall thickening. Normal appendix. Normal large bowel with no diverticulosis, large bowel wall thickening or pericolonic fat stranding. Vascular/Lymphatic: Atherosclerotic nonaneurysmal abdominal aorta. No pathologically enlarged lymph nodes in the abdomen or pelvis. Reproductive: Normal size prostate with nonspecific internal prostatic calcifications. Other: No pneumoperitoneum, ascites or focal fluid collection. Stable small fat containing bilateral inguinal hernias. Musculoskeletal: No aggressive appearing focal osseous lesions. Status post bilateral posterior spinal fusion at L5-S1. Moderate thoracolumbar spondylosis. IMPRESSION: 1. Obstructing 6 x 4 mm left lumbar ureteral stone at the L4 level, with moderate left hydroureteronephrosis . 2. Nonobstructing punctate interpolar right renal stone. No right hydronephrosis. 3.  Aortic Atherosclerosis (ICD10-I70.0). 4. Small stable bilateral fat containing inguinal hernias. Electronically Signed   By: Ilona Sorrel M.D.   On: 08/03/2017 08:36

## 2017-08-03 NOTE — ED Notes (Signed)
Pt discharged home after verbalizing understanding of discharge instructions; nad noted. 

## 2017-08-03 NOTE — ED Notes (Signed)
Pt presents with left flank pain since 0230. States he had some blood in his urine last weekend, no pain; then this morning he awakened with severe pain. He denies current hematuria. Pt's wife states he took oxycodone at 0230 and muscle relaxer at 0300 with no relief. Pt alert & oriented; writhing and moaning in pain during assessment.

## 2017-08-03 NOTE — ED Provider Notes (Signed)
[facility] Emergency Department Provider Note  ____________________________________________   First MD Initiated Contact with Patient 08/03/17 [phone]     (approximate)  I have reviewed the triage vital signs and the nursing notes.   HISTORY  Chief Complaint Flank Pain    HPI Nicholas Pro. is a 51 y.o. male who comes to the emergency department with severe left flank pain radiating to his groin that began at 2:30 in the morning roughly 4-5 hours prior to arrival. One week ago he had hematuria that resolved on its own. He has had multiple kidney stones in the past and has previously required surgery to remove the stones. He denies fevers or chills. He has severe unrelenting left flank pain. He took 2 doses of oxycodone at home without relief.His symptoms are severe left flank radiating towards the left groin nothing makes them better or worse.   Past Medical History:  Diagnosis Date  . Anxiety   . Chronic kidney disease    kidney stones since age 40  . GERD (gastroesophageal reflux disease)    with tomatoe products  . Hypercholesteremia   . Hypertension   . Sleep apnea    cpap bring machine tested 10-12 yrs ago    Patient Active Problem List   Diagnosis Date Noted  . Lumbosacral spondylosis without myelopathy 08/11/2014  . Lumbar spondylosis 08/11/2014    Past Surgical History:  Procedure Laterality Date  . BACK SURGERY     Jan 17 2014  . CYSTOSCOPY  2007  . MAXIMUM ACCESS (MAS)POSTERIOR LUMBAR INTERBODY FUSION (PLIF) 1 LEVEL N/A 08/11/2014   Procedure: FOR MAXIMUM ACCESS (MAS) POSTERIOR LUMBAR INTERBODY FUSION (PLIF) LUMBAR FIVE-SACRAL ONE;  Surgeon: Charlie Pitter, MD;  Location: [facility];  Service: Neurosurgery;  Laterality: N/A;  FOR MAXIMUM ACCESS (MAS) POSTERIOR LUMBAR INTERBODY FUSION (PLIF) LUMBAR FIVE-SACRAL ONE    Prior to Admission medications   Medication Sig Start Date End Date Taking? Authorizing Provider    cyclobenzaprine (FLEXERIL) 10 MG tablet Take 10 mg by mouth 3 (three) times daily as needed for muscle spasms.   Yes [provider]  oxyCODONE-acetaminophen (PERCOCET) 10-325 MG tablet Take 1-2 tablets by mouth every 4 (four) hours as needed for pain.   Yes [provider]  diazepam (VALIUM) 5 MG tablet Take 1-2 tablets (5-10 mg total) by mouth every 6 (six) hours as needed for muscle spasms. Patient not taking: Reported on 08/03/2017 08/13/14   Earnie Larsson, MD  oxyCODONE (ROXICODONE) 15 MG immediate release tablet Take 1-2 tablets (15-30 mg total) by mouth every 3 (three) hours as needed for severe pain. Patient not taking: Reported on 08/03/2017 08/13/14   Earnie Larsson, MD  tamsulosin (FLOMAX) 0.4 MG CAPS capsule Take 1 capsule (0.4 mg total) by mouth daily. 08/03/17   Darel Hong, MD    Allergies Amoxicillin-pot clavulanate; Clindamycin; and Cortisone acetate [cortisone]  No family history on file.  Social History Social History  Substance Use Topics  . Smoking status: Current Every Day Smoker    Packs/day: 1.00    Years: 30.00    Types: Cigarettes  . Smokeless tobacco: Not on file  . Alcohol use No     Comment: occassionally but none since January    Review of Systems Constitutional: No fever/chills Eyes: No visual changes. ENT: No sore throat. Cardiovascular: Denies chest pain. Respiratory: Denies shortness of breath. Gastrointestinal: Positive abdominal pain.  Positive nausea, no vomiting.  No diarrhea.  No constipation. Genitourinary:  Negative for dysuria. Musculoskeletal: Positive for back pain. Skin: Negative for rash. Neurological: Negative for headaches, focal weakness or numbness.   ____________________________________________   PHYSICAL EXAM:  VITAL SIGNS: ED Triage Vitals  Enc Vitals Group     BP 08/03/17 0740 (!) 159/84     Pulse Rate 08/03/17 0740 84     Resp 08/03/17 0740 (!) 22     Temp 08/03/17 0740 97.9 F (36.6 C)     Temp  Source 08/03/17 0740 Oral     SpO2 08/03/17 0740 98 %     Weight 08/03/17 0740 214 lb (97.1 kg)     Height --      Head Circumference --      Peak Flow --      Pain Score 08/03/17 0739 10     Pain Loc --      Pain Edu? --      Excl. in [place]? --     Constitutional: Alert and oriented 4 appears exquisitely uncomfortable Eyes: PERRL EOMI. Head: Atraumatic. Nose: No congestion/rhinnorhea. Mouth/Throat: No trismus Neck: No stridor.   Cardiovascular: Tachycardic rate, regular rhythm. Grossly normal heart sounds.  Good peripheral circulation. Respiratory: Increased respiratory effort.  No retractions. Lungs CTAB and moving good air Gastrointestinal: Soft nondistended nontender no rebound or guarding no peritonitis no costovertebral tenderness Musculoskeletal: No lower extremity edema   Neurologic:  Normal speech and language. No gross focal neurologic deficits are appreciated. Skin:  Skin is warm, dry and intact. No rash noted. Psychiatric: Mood and affect are normal. Speech and behavior are normal.    ____________________________________________   DIFFERENTIAL includes but not limited to  Kidney stone, pyelonephritis, renal failure, AAA ____________________________________________   LABS (all labs ordered are listed, but only abnormal results are displayed)  Labs Reviewed  URINALYSIS, COMPLETE (UACMP) WITH MICROSCOPIC - Abnormal; Notable for the following:       Result Value   Color, Urine YELLOW (*)    APPearance CLEAR (*)    Hgb urine dipstick LARGE (*)    Squamous Epithelial / LPF 0-5 (*)    All other components within normal limits  COMPREHENSIVE METABOLIC PANEL - Abnormal; Notable for the following:    Glucose, Bld 137 (*)    All other components within normal limits  CBC WITH DIFFERENTIAL/PLATELET - Abnormal; Notable for the following:    WBC 11.3 (*)    RDW 14.9 (*)    Neutro Abs 7.7 (*)    All other components within normal limits    Hematuria with no signs of  infection normal renal function __________________________________________  EKG   ____________________________________________  RADIOLOGY  CT scan shows 6 x 4 mm left mid ureter stone ____________________________________________   PROCEDURES  Procedure(s) performed: no  Procedures  Critical Care performed: no  Observation: no ____________________________________________   INITIAL IMPRESSION / ASSESSMENT AND PLAN / ED COURSE  Pertinent labs & imaging results that were available during my care of the patient were reviewed by me and considered in my medical decision making (see chart for details).  On arrival the patient is extremely uncomfortable appearing with a history most concerning for renal colic requiring intravenous opioid pain medication. Blood work for renal function as well as a CT scan are pending.      ----------------------------------------- 8:59 AM on 08/03/2017 -----------------------------------------  After morphine and Toradol the patient's pain is now down to a 5 out of 10 and he is more comfortable. CT scan shows a 6 x 4 mm midureteral stone  on the left which is obstructing. I will reach out to urology now given the duration of the patient's symptoms. ____________________________________________  I discussed the case with Dr. Jeffie Pollock of urology who said that he feels the patient is stable for outpatient management however the patient would prefer to be intervened upon he could come evaluate the patient later on today. I discussed this with the patient who would prefer a trial of pain medication at medical's pulsatile therapy and outpatient management. Strict return precautions given and he is discharged home in improved condition.  FINAL CLINICAL IMPRESSION(S) / ED DIAGNOSES  Final diagnoses:  Left flank pain  Nephrolithiasis      NEW MEDICATIONS STARTED DURING THIS VISIT:  Discharge Medication List as of 08/03/2017 10:18 AM    START taking  these medications   Details  tamsulosin (FLOMAX) 0.4 MG CAPS capsule Take 1 capsule (0.4 mg total) by mouth daily., Starting Thu 08/03/2017, Print         Note:  This document was prepared using Dragon voice recognition software and may include unintentional dictation errors.     Darel Hong, MD 08/04/17 [phone]

## 2017-08-09 ENCOUNTER — Emergency Department
Admission: EM | Admit: 2017-08-09 | Discharge: 2017-08-09 | Disposition: A | Discharge status: Home / Self Care | Payer: Disability Insurance | Attending: Emergency Medicine | Admitting: Emergency Medicine

## 2017-08-09 DIAGNOSIS — F17210 Nicotine dependence, cigarettes, uncomplicated: Secondary | ICD-10-CM | POA: Insufficient documentation

## 2017-08-09 DIAGNOSIS — I129 Hypertensive chronic kidney disease with stage 1 through stage 4 chronic kidney disease, or unspecified chronic kidney disease: Secondary | ICD-10-CM | POA: Insufficient documentation

## 2017-08-09 DIAGNOSIS — N189 Chronic kidney disease, unspecified: Secondary | ICD-10-CM | POA: Insufficient documentation

## 2017-08-09 DIAGNOSIS — N23 Unspecified renal colic: Secondary | ICD-10-CM

## 2017-08-09 DIAGNOSIS — N201 Calculus of ureter: Secondary | ICD-10-CM | POA: Insufficient documentation

## 2017-08-09 LAB — BASIC METABOLIC PANEL
Anion gap: 11 (ref 5–15)
BUN: 21 mg/dL — ABNORMAL HIGH (ref 6–20)
CHLORIDE: 104 mmol/L (ref 101–111)
CO2: 23 mmol/L (ref 22–32)
CREATININE: 1.16 mg/dL (ref 0.61–1.24)
Calcium: 9.2 mg/dL (ref 8.9–10.3)
GFR calc non Af Amer: >60 mL/min (ref >60)
Glucose, Bld: 122 mg/dL — ABNORMAL HIGH (ref 65–99)
POTASSIUM: 3.9 mmol/L (ref 3.5–5.1)
SODIUM: 138 mmol/L (ref 135–145)

## 2017-08-09 LAB — URINALYSIS, COMPLETE (UACMP) WITH MICROSCOPIC
BILIRUBIN URINE: NEGATIVE
Glucose, UA: NEGATIVE mg/dL
Ketones, ur: NEGATIVE mg/dL
LEUKOCYTES UA: NEGATIVE
NITRITE: NEGATIVE
PH: 7 (ref 5.0–8.0)
Protein, ur: NEGATIVE mg/dL
SPECIFIC GRAVITY, URINE: 1.011 (ref 1.005–1.030)
SQUAMOUS EPITHELIAL / LPF: NONE SEEN

## 2017-08-09 LAB — CBC WITH DIFFERENTIAL/PLATELET
Basophils Absolute: 0.1 10*3/uL (ref 0–0.1)
Basophils Relative: 1 %
EOS ABS: 0.2 10*3/uL (ref 0–0.7)
Eosinophils Relative: 2 %
HEMATOCRIT: 45.6 % (ref 40.0–52.0)
HEMOGLOBIN: 16 g/dL (ref 13.0–18.0)
LYMPHS ABS: 1.4 10*3/uL (ref 1.0–3.6)
LYMPHS PCT: 12 %
MCH: 30.1 pg (ref 26.0–34.0)
MCHC: 35.1 g/dL (ref 32.0–36.0)
MCV: 85.8 fL (ref 80.0–100.0)
MONOS PCT: 7 %
Monocytes Absolute: 0.8 10*3/uL (ref 0.2–1.0)
NEUTROS ABS: 9 10*3/uL — AB (ref 1.4–6.5)
NEUTROS PCT: 78 %
Platelets: 148 10*3/uL — ABNORMAL LOW (ref 150–440)
RBC: 5.31 MIL/uL (ref 4.40–5.90)
RDW: 14.5 % (ref 11.5–14.5)
WBC: 11.4 10*3/uL — AB (ref 3.8–10.6)

## 2017-08-09 MED ORDER — ONDANSETRON 4 MG PO TBDP: 4.0000 mg | ORAL_TABLET | Freq: Three times a day (TID) | ORAL | 0 refills | Status: DC | PRN

## 2017-08-09 MED ORDER — SODIUM CHLORIDE 0.9 % IV SOLN: 8.0000 mg | Freq: Once | INTRAVENOUS | Status: DC

## 2017-08-09 MED ORDER — SODIUM CHLORIDE 0.9 % IV BOLUS (SEPSIS)
1000.0000 mL | Freq: Once | INTRAVENOUS | Status: AC
  Administered 2017-08-09: 1000 mL via INTRAVENOUS

## 2017-08-09 MED ORDER — KETOROLAC TROMETHAMINE 30 MG/ML IJ SOLN
INTRAMUSCULAR | Status: AC
  Filled 2017-08-09: qty 1

## 2017-08-09 MED ORDER — HYDROMORPHONE HCL 1 MG/ML IJ SOLN
INTRAMUSCULAR | Status: AC
  Administered 2017-08-09: 1 mg via INTRAVENOUS
  Filled 2017-08-09: qty 1

## 2017-08-09 MED ORDER — HYDROMORPHONE HCL 1 MG/ML IJ SOLN
1.0000 mg | Freq: Once | INTRAMUSCULAR | Status: AC
  Administered 2017-08-09: 1 mg via INTRAVENOUS

## 2017-08-09 MED ORDER — ONDANSETRON HCL 4 MG/2ML IJ SOLN
INTRAMUSCULAR | Status: AC
  Administered 2017-08-09: 4 mg via INTRAVENOUS
  Filled 2017-08-09: qty 2

## 2017-08-09 MED ORDER — ONDANSETRON HCL 4 MG/2ML IJ SOLN
4.0000 mg | Freq: Once | INTRAMUSCULAR | Status: AC
  Administered 2017-08-09: 4 mg via INTRAVENOUS

## 2017-08-09 MED ORDER — HYDROMORPHONE HCL 1 MG/ML PO LIQD: 1.0000 mg | Freq: Once | ORAL | Status: DC

## 2017-08-09 MED ORDER — KETOROLAC TROMETHAMINE 30 MG/ML IJ SOLN
30.0000 mg | Freq: Once | INTRAMUSCULAR | Status: AC
  Administered 2017-08-09: 30 mg via INTRAVENOUS
  Filled 2017-08-09: qty 1

## 2017-08-09 MED ORDER — ONDANSETRON HCL 4 MG/2ML IJ SOLN
INTRAMUSCULAR | Status: AC
  Filled 2017-08-09: qty 2

## 2017-08-09 MED ORDER — HYDROMORPHONE HCL 1 MG/ML IJ SOLN
1.0000 mg | Freq: Once | INTRAMUSCULAR | Status: AC
  Administered 2017-08-09: 1 mg via INTRAVENOUS
  Filled 2017-08-09: qty 1

## 2017-08-09 NOTE — ED Triage Notes (Signed)
Pt presents to ED via POV with c/o LEFT flank pain r/t kidney stones. Pt reports being seen here for same last Thursday morning; returns because "it hasn't passed yet and I can't take the pain". Pt is A&O, in moderate distress r/t pain; RR even, regular, and unlabored; skin color/temp WNL.

## 2017-08-09 NOTE — ED Provider Notes (Signed)
[facility] Emergency Department Provider Note   ____________________________________________   First MD Initiated Contact with Patient 08/09/17 2054     (approximate)  I have reviewed the triage vital signs and the nursing notes.   HISTORY  Chief Complaint Flank Pain   HPI Nicholas Greene. is a 51 y.o. male Was seen last week for 6 x 4 m stone on the left side.He hasn't passed in a week and today he became nauseated and vomiting and had a lot of pain with it. He couldn't keep down his pain medicine. He is not running a fever not having any change in any of his other symptoms. Pain is 10 out of 10 and however.patient got Toradol IV without any relief. Then he got Dilaudid 1 mghich took the pain down to a 7 and 1 more milligram of Diudid took the pain down to a 3. Patient feels better. Discussed patient with Dr. Erlene Quan who wants to see him in the office tomorrow. Patient is no longer nauseated he is willing to go home and see Dr. Erlene Quan tomorrow.   Past Medical History:  Diagnosis Date  . Anxiety   . Chronic kidney disease    kidney stones since age 7  . GERD (gastroesophageal reflux disease)    with tomatoe products  . Hypercholesteremia   . Hypertension   . Sleep apnea    cpap bring machine tested 10-12 yrs ago    Patient Active Problem List   Diagnosis Date Noted  . Lumbosacral spondylosis without myelopathy 08/11/2014  . Lumbar spondylosis 08/11/2014    Past Surgical History:  Procedure Laterality Date  . BACK SURGERY     Jan 17 2014  . CYSTOSCOPY  2007  . MAXIMUM ACCESS (MAS)POSTERIOR LUMBAR INTERBODY FUSION (PLIF) 1 LEVEL N/A 08/11/2014   Procedure: FOR MAXIMUM ACCESS (MAS) POSTERIOR LUMBAR INTERBODY FUSION (PLIF) LUMBAR FIVE-SACRAL ONE;  Surgeon: Charlie Pitter, MD;  Location: [facility];  Service: Neurosurgery;  Laterality: N/A;  FOR MAXIMUM ACCESS (MAS) POSTERIOR LUMBAR INTERBODY FUSION (PLIF) LUMBAR FIVE-SACRAL ONE    Prior  to Admission medications   Medication Sig Start Date End Date Taking? Authorizing Provider  cyclobenzaprine (FLEXERIL) 10 MG tablet Take 10 mg by mouth 3 (three) times daily as needed for muscle spasms.    [provider]  diazepam (VALIUM) 5 MG tablet Take 1-2 tablets (5-10 mg total) by mouth every 6 (six) hours as needed for muscle spasms. Patient not taking: Reported on 08/03/2017 08/13/14   Earnie Larsson, MD  ondansetron (ZOFRAN ODT) 4 MG disintegrating tablet Take 1 tablet (4 mg total) by mouth every 8 (eight) hours as needed for nausea or vomiting. 08/09/17   Nena Polio, MD  oxyCODONE (ROXICODONE) 15 MG immediate release tablet Take 1-2 tablets (15-30 mg total) by mouth every 3 (three) hours as needed for severe pain. Patient not taking: Reported on 08/03/2017 08/13/14   Earnie Larsson, MD  oxyCODONE-acetaminophen (PERCOCET) 10-325 MG tablet Take 1-2 tablets by mouth every 4 (four) hours as needed for pain.    [provider]  tamsulosin (FLOMAX) 0.4 MG CAPS capsule Take 1 capsule (0.4 mg total) by mouth daily. 08/03/17   Darel Hong, MD    Allergies Amoxicillin-pot clavulanate; Clindamycin; and Cortisone acetate [cortisone]  No family history on file.  Social History Social History  Substance Use Topics  . Smoking status: Current Every Day Smoker    Packs/day: 1.00    Years: 30.00  Types: Cigarettes  . Smokeless tobacco: Never Used  . Alcohol use No     Comment: occassionally but none since January    Review of Systems Constitutional: No fever/chills Eyes: No visual changes. ENT: No sore throat. Cardiovascular: Denies chest pain. Respiratory: Denies shortness of breath. Gastrointestinal: see history of present illness Genitourinary:dysuria. Musculoskeletal: Negative for back pain. Skin: Negative for rash. Neurological: Negative for headaches, focal weakness  ____________________________________________   PHYSICAL EXAM:  VITAL SIGNS: ED Triage  Vitals  Enc Vitals Group     BP 08/09/17 2039 (!) 127/93     Pulse Rate 08/09/17 2039 78     Resp 08/09/17 2039 18     Temp 08/09/17 2039 98.5 F (36.9 C)     Temp Source 08/09/17 2039 Oral     SpO2 08/09/17 2039 99 %     Weight 08/09/17 2040 214 lb (97.1 kg)     Height 08/09/17 2040 5' 6"  (1.676 m)     Head Circumference --      Peak Flow --      Pain Score 08/09/17 2039 10     Pain Loc --      Pain Edu? --      Excl. in [place]? --     Constitutional: Alert and oriented.in pain Eyes: Conjunctivae are normal.  Head: Atraumatic. Nose: No congestion/rhinnorhea. Mouth/Throat: Mucous membranes are moist.  Oropharynx non-erythematous. Neck: No stridor.   Cardiovascular: Normal rate, regular rhythm. Grossly normal heart sounds.  Good peripheral circulation. Respiratory: Normal respiratory effort.  No retractions. Lungs CTAB. Gastrointestinal: Soft and nontender. No distention. No abdominal bruits Musculoskeletal: No lower extremity tenderness nor edema.  No joint effusions. Neurologic:  Normal speech and language. No gross focal neurologic deficits are appreciated Skin:  Skin is warm, dry and intact. No rash noted. Psychiatric: Mood and affect are normal. Speech and behavior are normal.  ____________________________________________   LABS (all labs ordered are listed, but only abnormal results are displayed)  Labs Reviewed  URINALYSIS, COMPLETE (UACMP) WITH MICROSCOPIC - Abnormal; Notable for the following:       Result Value   Color, Urine YELLOW (*)    APPearance CLEAR (*)    Hgb urine dipstick LARGE (*)    Bacteria, UA RARE (*)    All other components within normal limits  BASIC METABOLIC PANEL - Abnormal; Notable for the following:    Glucose, Bld 122 (*)    BUN 21 (*)    All other components within normal limits  CBC WITH DIFFERENTIAL/PLATELET - Abnormal; Notable for the following:    WBC 11.4 (*)    Platelets 148 (*)    Neutro Abs 9.0 (*)    All other components  within normal limits   ____________________________________________  EKG   ____________________________________________  RADIOLOGY   ____________________________________________   PROCEDURES  Procedure(s) performed:  Procedures  Critical Care performed:   ____________________________________________   INITIAL IMPRESSION / ASSESSMENT AND PLAN / ED COURSE  Pertinent labs & imaging results that were available during my care of the patient were reviewed by me and considered in my medical decision making (see chart for details).        ____________________________________________   FINAL CLINICAL IMPRESSION(S) / ED DIAGNOSES  Final diagnoses:  Ureteral colic      NEW MEDICATIONS STARTED DURING THIS VISIT:  New Prescriptions   ONDANSETRON (ZOFRAN ODT) 4 MG DISINTEGRATING TABLET    Take 1 tablet (4 mg total) by mouth every 8 (eight) hours as needed  for nausea or vomiting.     Note:  This document was prepared using Dragon voice recognition software and may include unintentional dictation errors.    Nena Polio, MD 08/09/17 2249

## 2017-08-09 NOTE — Discharge Instructions (Signed)
Call Dr. Erlene Quan first thing in the morning she was trying to chew in the office tomorrow. They will see in the office tomorrow. Please tell them that she were in the ER and Dr. Cinda Quest spoke with Dr. Erlene Quan.

## 2017-08-10 ENCOUNTER — Telehealth: Payer: Self-pay | Admitting: Urology

## 2017-08-10 ENCOUNTER — Ambulatory Visit: Payer: Self-pay | Admitting: Urology

## 2017-08-10 NOTE — Telephone Encounter (Signed)
done

## 2017-08-10 NOTE — Telephone Encounter (Signed)
-----   Message from Irine Seal, MD sent at 08/03/2017  9:11 AM EDT ----- Juluis Rainier  This fellow is in the ER at [facility]. With a 3x15m left proximal stone and a week of pain.  He got Toradol so no ESWL today.   I asked the ER doc to offer him outpatient f/u vs ureteroscopy tonight.   The ER doc will let me know what he decides.

## 2017-08-11 ENCOUNTER — Encounter: Payer: Self-pay | Admitting: Urology

## 2017-08-11 ENCOUNTER — Ambulatory Visit (INDEPENDENT_AMBULATORY_CARE_PROVIDER_SITE_OTHER): Payer: Self-pay | Admitting: Urology

## 2017-08-11 ENCOUNTER — Telehealth: Payer: Self-pay | Admitting: Radiology

## 2017-08-11 ENCOUNTER — Other Ambulatory Visit: Payer: Self-pay | Admitting: Radiology

## 2017-08-11 VITALS — BP 123/79 | HR 80 | Ht 66.0 in | Wt 215.6 lb

## 2017-08-11 DIAGNOSIS — N201 Calculus of ureter: Secondary | ICD-10-CM

## 2017-08-11 DIAGNOSIS — N200 Calculus of kidney: Secondary | ICD-10-CM

## 2017-08-11 DIAGNOSIS — N1330 Unspecified hydronephrosis: Secondary | ICD-10-CM

## 2017-08-11 NOTE — Progress Notes (Signed)
08/11/2017 10:52 AM   Nicholas Giovanni Jr. [birth year] [id number]  Referring provider: No referring provider defined for this encounter.  Chief Complaint  Patient presents with  . Nephrolithiasis    New Patient    HPI: 51 year old male with a history of nephrolithiasis with a 6 mm left obstructing ureteral calculus who presents today to discuss management.  He continues to have pain from the stone. He has been seen 2 x over the past 10 days.  Most recently, he was emergency room on 06/09/2017 with severe pain, nausea and vomiting.   Cr mildly elevated to 1.16.  UA negative other than for blood.    Today, he continues to left flank pain. His nausea is improving but still present. He has been taking narcotics continuously and been able to work. No fevers or chills. No dysuria or gross hematuria.  He does have a personal history of stone.  He has required surgical intervention x 2 including URS and EWSL.  His last stone episode was 2013.     PMH: Past Medical History:  Diagnosis Date  . Anxiety   . Chronic kidney disease    kidney stones since age 49  . GERD (gastroesophageal reflux disease)    with tomatoe products  . Hypercholesteremia   . Hypertension   . Sleep apnea    cpap bring machine tested 10-12 yrs ago    Surgical History: Past Surgical History:  Procedure Laterality Date  . BACK SURGERY     Jan 17 2014  . CYSTOSCOPY  2007  . MAXIMUM ACCESS (MAS)POSTERIOR LUMBAR INTERBODY FUSION (PLIF) 1 LEVEL N/A 08/11/2014   Procedure: FOR MAXIMUM ACCESS (MAS) POSTERIOR LUMBAR INTERBODY FUSION (PLIF) LUMBAR FIVE-SACRAL ONE;  Surgeon: Charlie Pitter, MD;  Location: [facility];  Service: Neurosurgery;  Laterality: N/A;  FOR MAXIMUM ACCESS (MAS) POSTERIOR LUMBAR INTERBODY FUSION (PLIF) LUMBAR FIVE-SACRAL ONE    Home Medications:  Allergies as of 08/11/2017      Reactions   Amoxicillin-pot Clavulanate Anaphylaxis   Has patient had a PCN reaction causing immediate rash,  facial/tongue/throat swelling, SOB or lightheadedness with hypotension: Yes Has patient had a PCN reaction causing severe rash involving mucus membranes or skin necrosis: No Has patient had a PCN reaction that required hospitalization: No Has patient had a PCN reaction occurring within the last 10 years: Yes If all of the above answers are "NO", then may proceed with Cephalosporin use.   Clindamycin Other (See Comments)   Hallucinations    Cortisone Acetate [cortisone] Other (See Comments)   Shots caused redness and swelling at injection sites      Medication List       Accurate as of 08/11/17 11:59 PM. Always use your most recent med list.          cyclobenzaprine 10 MG tablet Commonly known as:  FLEXERIL Take 10 mg by mouth 2 (two) times daily as needed for muscle spasms.   ibuprofen 200 MG tablet Commonly known as:  ADVIL,MOTRIN Take 800 mg by mouth daily as needed for moderate pain.   ondansetron 4 MG disintegrating tablet Commonly known as:  ZOFRAN ODT Take 1 tablet (4 mg total) by mouth every 8 (eight) hours as needed for nausea or vomiting.   oxyCODONE-acetaminophen 10-325 MG tablet Commonly known as:  PERCOCET Take 1 tablet by mouth every 8 (eight) hours as needed for pain.   tamsulosin 0.4 MG Caps capsule Commonly known as:  FLOMAX Take 1 capsule (0.4 mg total)  by mouth daily.       Allergies:  Allergies  Allergen Reactions  . Amoxicillin-Pot Clavulanate Anaphylaxis    Has patient had a PCN reaction causing immediate rash, facial/tongue/throat swelling, SOB or lightheadedness with hypotension: Yes Has patient had a PCN reaction causing severe rash involving mucus membranes or skin necrosis: No Has patient had a PCN reaction that required hospitalization: No Has patient had a PCN reaction occurring within the last 10 years: Yes If all of the above answers are "NO", then may proceed with Cephalosporin use.   . Clindamycin Other (See Comments)     Hallucinations   . Cortisone Acetate [Cortisone] Other (See Comments)    Shots caused redness and swelling at injection sites    Family History: Family History  Problem Relation Age of Onset  . Hematuria Mother   . Prostate cancer Father     Social History:  reports that he has been smoking Cigarettes.  He has a 30.00 pack-year smoking history. He has never used smokeless tobacco. He reports that he does not drink alcohol or use drugs.  ROS: UROLOGY Frequent Urination?: No Hard to postpone urination?: No Burning/pain with urination?: No Get up at night to urinate?: No Leakage of urine?: No Urine stream starts and stops?: No Trouble starting stream?: No Do you have to strain to urinate?: No Blood in urine?: Yes Urinary tract infection?: No Sexually transmitted disease?: No Injury to kidneys or bladder?: No Painful intercourse?: No Weak stream?: No Erection problems?: No Penile pain?: No  Gastrointestinal Nausea?: Yes Vomiting?: Yes Indigestion/heartburn?: Yes Diarrhea?: No Constipation?: Yes  Constitutional Fever: No Night sweats?: No Weight loss?: No Fatigue?: No  Skin Skin rash/lesions?: No Itching?: No  Eyes Blurred vision?: No Double vision?: No  Ears/Nose/Throat Sore throat?: No Sinus problems?: No  Hematologic/Lymphatic Swollen glands?: No Easy bruising?: No  Cardiovascular Leg swelling?: No Chest pain?: No  Respiratory Cough?: No Shortness of breath?: No  Endocrine Excessive thirst?: No  Musculoskeletal Back pain?: Yes Joint pain?: No  Neurological Headaches?: No Dizziness?: No  Psychologic Depression?: No Anxiety?: No  Physical Exam: BP 123/79 (BP Location: Left Arm, Patient Position: Sitting, Cuff Size: Large)   Pulse 80   Ht 5' 6"  (1.676 m)   Wt 215 lb 9.6 oz (97.8 kg)   BMI 34.80 kg/m   Constitutional:  Alert and oriented, mild distress.  Accompanied by wife today. HEENT: [place] AT, moist mucus membranes.  Trachea  midline, no masses. Cardiovascular: No clubbing, cyanosis, or edema. Respiratory: Normal respiratory effort, no increased work of breathing. GI: Abdomen is soft, nontender, nondistended, no abdominal masses GU: +L CVA tenderness.   Skin: No rashes, bruises or suspicious lesions. Neurologic: Grossly intact, no focal deficits, moving all 4 extremities. Psychiatric: Normal mood and affect.  Laboratory Data: Lab Results  Component Value Date   WBC 11.4 (H) 08/09/2017   HGB 16.0 08/09/2017   HCT 45.6 08/09/2017   MCV 85.8 08/09/2017   PLT 148 (L) 08/09/2017    Lab Results  Component Value Date   CREATININE 1.16 08/09/2017    Lab Results  Component Value Date   HGBA1C 7.8 (H) 03/15/2013    Urinalysis    Component Value Date/Time   COLORURINE YELLOW (A) 08/09/2017 2107   APPEARANCEUR CLEAR (A) 08/09/2017 2107   APPEARANCEUR Clear 03/14/2013 1247   LABSPEC 1.011 08/09/2017 2107   LABSPEC 1.019 03/14/2013 1247   PHURINE 7.0 08/09/2017 2107   GLUCOSEU NEGATIVE 08/09/2017 2107   GLUCOSEU 50 mg/dL 03/14/2013  [place] (A) 08/09/2017 2107   BILIRUBINUR NEGATIVE 08/09/2017 2107   BILIRUBINUR Negative 03/14/2013 [place] 08/09/2017 2107   PROTEINUR NEGATIVE 08/09/2017 2107   NITRITE NEGATIVE 08/09/2017 2107   LEUKOCYTESUR NEGATIVE 08/09/2017 2107   LEUKOCYTESUR Negative 03/14/2013 1247    Pertinent Imaging: CLINICAL DATA:  Severe left flank pain and nausea since last night. History of nephrolithiasis.  EXAM: CT ABDOMEN AND PELVIS WITHOUT CONTRAST  TECHNIQUE: Multidetector CT imaging of the abdomen and pelvis was performed following the standard protocol without IV contrast.  COMPARISON:  09/08/2012 CT abdomen/pelvis.  FINDINGS: Lower chest: No significant pulmonary nodules or acute consolidative airspace disease.  Hepatobiliary: Normal liver with no liver mass. Normal gallbladder with no radiopaque cholelithiasis. No biliary ductal  dilatation.  Pancreas: Normal, with no mass or duct dilation.  Spleen: Normal size. No mass.  Adrenals/Urinary Tract: Normal adrenals. Nonobstructing 2 mm interpolar right renal stone. No right hydronephrosis. Obstructing 6 x 4 mm left lumbar ureteral stone at the L4 level with moderate left hydroureteronephrosis and asymmetric left perinephric fat stranding. No additional stones in the left renal collecting system or left ureter. Normal caliber right ureter with no right ureteral stones. Simple 1.8 cm renal cyst in the posterior upper left kidney. No additional contour deforming renal lesions. Normal bladder.  Stomach/Bowel: Grossly normal stomach. Normal caliber small bowel with no small bowel wall thickening. Normal appendix. Normal large bowel with no diverticulosis, large bowel wall thickening or pericolonic fat stranding.  Vascular/Lymphatic: Atherosclerotic nonaneurysmal abdominal aorta. No pathologically enlarged lymph nodes in the abdomen or pelvis.  Reproductive: Normal size prostate with nonspecific internal prostatic calcifications.  Other: No pneumoperitoneum, ascites or focal fluid collection. Stable small fat containing bilateral inguinal hernias.  Musculoskeletal: No aggressive appearing focal osseous lesions. Status post bilateral posterior spinal fusion at L5-S1. Moderate thoracolumbar spondylosis.  IMPRESSION: 1. Obstructing 6 x 4 mm left lumbar ureteral stone at the L4 level, with moderate left hydroureteronephrosis . 2. Nonobstructing punctate interpolar right renal stone. No right hydronephrosis. 3.  Aortic Atherosclerosis (ICD10-I70.0). 4. Small stable bilateral fat containing inguinal hernias.   Electronically Signed   By: Ilona Sorrel M.D.   On: 08/03/2017 08:36  CT scan was personally reviewed today with the patient.  Assessment & Plan:    1. Left ureteral stone We discussed various treatment options including ESWL vs.  ureteroscopy, laser lithotripsy, and stent vs continued medical expulsive therapy. We discussed the risks and benefits of both including bleeding, infection, damage to surrounding structures, efficacy with need for possible further intervention, and need for temporary ureteral stent.  He is most interested in URS, LL, stent.  All questions answered.    Plan for OR Monday.    Preop urine culture.    2. Hydronephrosis, left Secondary to #1  3. Right kidney stone Punctate right renal stone- recommend observation   Schedule L URS  Hollice Espy, MD  [facility] [address] [place], [place] [address] [phone]

## 2017-08-11 NOTE — Telephone Encounter (Signed)
Pt scheduled for left URS on 08/14/17 with Dr Erlene Quan. Made pt aware of surgery date & arrival time during office visit. Advised pt to be npo after mn on 08/13/17. Questions answered. Pt voices understanding.

## 2017-08-13 MED ORDER — CIPROFLOXACIN IN D5W 400 MG/200ML IV SOLN
400.0000 mg | INTRAVENOUS | Status: AC
  Administered 2017-08-14: 400 mg via INTRAVENOUS

## 2017-08-14 ENCOUNTER — Ambulatory Visit: Payer: Self-pay | Admitting: Certified Registered Nurse Anesthetist

## 2017-08-14 ENCOUNTER — Encounter
Admission: RE | Disposition: A | Discharge status: Home / Self Care | Payer: Self-pay | Source: Ambulatory Visit | Attending: Urology

## 2017-08-14 ENCOUNTER — Ambulatory Visit
Admission: RE | Admit: 2017-08-14 | Discharge: 2017-08-14 | Disposition: A | Discharge status: Home / Self Care | Payer: Self-pay | Source: Ambulatory Visit | Attending: Urology | Admitting: Urology

## 2017-08-14 ENCOUNTER — Encounter: Payer: Self-pay | Admitting: *Deleted

## 2017-08-14 DIAGNOSIS — N201 Calculus of ureter: Secondary | ICD-10-CM

## 2017-08-14 DIAGNOSIS — F17210 Nicotine dependence, cigarettes, uncomplicated: Secondary | ICD-10-CM | POA: Insufficient documentation

## 2017-08-14 DIAGNOSIS — N132 Hydronephrosis with renal and ureteral calculous obstruction: Secondary | ICD-10-CM | POA: Insufficient documentation

## 2017-08-14 DIAGNOSIS — Z87442 Personal history of urinary calculi: Secondary | ICD-10-CM | POA: Insufficient documentation

## 2017-08-14 DIAGNOSIS — I10 Essential (primary) hypertension: Secondary | ICD-10-CM | POA: Insufficient documentation

## 2017-08-14 DIAGNOSIS — K219 Gastro-esophageal reflux disease without esophagitis: Secondary | ICD-10-CM | POA: Insufficient documentation

## 2017-08-14 DIAGNOSIS — G4730 Sleep apnea, unspecified: Secondary | ICD-10-CM | POA: Insufficient documentation

## 2017-08-14 HISTORY — PX: CYSTOSCOPY/URETEROSCOPY/HOLMIUM LASER/STENT PLACEMENT: SHX6546

## 2017-08-14 SURGERY — CYSTOSCOPY/URETEROSCOPY/HOLMIUM LASER/STENT PLACEMENT
Anesthesia: General | Laterality: Left | Wound class: Clean Contaminated

## 2017-08-14 MED ORDER — FENTANYL CITRATE (PF) 100 MCG/2ML IJ SOLN
25.0000 ug | INTRAMUSCULAR | Status: DC | PRN
  Administered 2017-08-14 (×3): 25 ug via INTRAVENOUS

## 2017-08-14 MED ORDER — ONDANSETRON HCL 4 MG/2ML IJ SOLN
INTRAMUSCULAR | Status: AC
  Filled 2017-08-14: qty 2

## 2017-08-14 MED ORDER — MIDAZOLAM HCL 2 MG/2ML IJ SOLN
INTRAMUSCULAR | Status: DC | PRN
  Administered 2017-08-14: 2 mg via INTRAVENOUS

## 2017-08-14 MED ORDER — FENTANYL CITRATE (PF) 100 MCG/2ML IJ SOLN
INTRAMUSCULAR | Status: DC | PRN
  Administered 2017-08-14 (×2): 50 ug via INTRAVENOUS

## 2017-08-14 MED ORDER — LACTATED RINGERS IV SOLN
INTRAVENOUS | Status: DC
  Administered 2017-08-14: 12:00:00 via INTRAVENOUS

## 2017-08-14 MED ORDER — OXYCODONE HCL 5 MG PO TABS
5.0000 mg | ORAL_TABLET | Freq: Once | ORAL | Status: AC | PRN
  Administered 2017-08-14: 5 mg via ORAL

## 2017-08-14 MED ORDER — PROPOFOL 10 MG/ML IV BOLUS
INTRAVENOUS | Status: DC | PRN
  Administered 2017-08-14: 150 mg via INTRAVENOUS

## 2017-08-14 MED ORDER — PROPOFOL 10 MG/ML IV BOLUS
INTRAVENOUS | Status: AC
  Filled 2017-08-14: qty 20

## 2017-08-14 MED ORDER — BELLADONNA ALKALOIDS-OPIUM 16.2-60 MG RE SUPP: 1.0000 | Freq: Every day | RECTAL | Status: DC

## 2017-08-14 MED ORDER — ONDANSETRON HCL 4 MG/2ML IJ SOLN
INTRAMUSCULAR | Status: DC | PRN
  Administered 2017-08-14: 4 mg via INTRAVENOUS

## 2017-08-14 MED ORDER — OXYBUTYNIN CHLORIDE 5 MG PO TABS: 5.0000 mg | ORAL_TABLET | Freq: Three times a day (TID) | ORAL | 0 refills | Status: DC | PRN

## 2017-08-14 MED ORDER — OXYCODONE HCL 5 MG PO TABS
ORAL_TABLET | ORAL | Status: AC
  Filled 2017-08-14: qty 1

## 2017-08-14 MED ORDER — OXYBUTYNIN CHLORIDE 5 MG PO TABS
ORAL_TABLET | ORAL | Status: AC
  Administered 2017-08-14: 5 mg via ORAL
  Filled 2017-08-14: qty 1

## 2017-08-14 MED ORDER — OXYCODONE HCL 5 MG/5ML PO SOLN: 5.0000 mg | Freq: Once | ORAL | Status: AC | PRN

## 2017-08-14 MED ORDER — FENTANYL CITRATE (PF) 100 MCG/2ML IJ SOLN
INTRAMUSCULAR | Status: AC
  Administered 2017-08-14: 25 ug via INTRAVENOUS
  Filled 2017-08-14: qty 2

## 2017-08-14 MED ORDER — OXYBUTYNIN CHLORIDE 5 MG PO TABS
5.0000 mg | ORAL_TABLET | Freq: Three times a day (TID) | ORAL | Status: DC | PRN
  Administered 2017-08-14: 5 mg via ORAL
  Filled 2017-08-14 (×2): qty 1

## 2017-08-14 MED ORDER — FENTANYL CITRATE (PF) 100 MCG/2ML IJ SOLN
INTRAMUSCULAR | Status: AC
  Filled 2017-08-14: qty 2

## 2017-08-14 MED ORDER — CIPROFLOXACIN IN D5W 400 MG/200ML IV SOLN
INTRAVENOUS | Status: AC
  Filled 2017-08-14: qty 200

## 2017-08-14 MED ORDER — MIDAZOLAM HCL 2 MG/2ML IJ SOLN
INTRAMUSCULAR | Status: AC
  Filled 2017-08-14: qty 2

## 2017-08-14 SURGICAL SUPPLY — 31 items
ADHESIVE MASTISOL STRL (MISCELLANEOUS) ×3 IMPLANT
BAG DRAIN CYSTO-URO LG1000N (MISCELLANEOUS) ×3 IMPLANT
BASKET ZERO TIP 1.9FR (BASKET) ×3 IMPLANT
BRUSH SCRUB EZ 1% IODOPHOR (MISCELLANEOUS) ×3 IMPLANT
CATH URETL 5X70 OPEN END (CATHETERS) ×3 IMPLANT
CNTNR SPEC 2.5X3XGRAD LEK (MISCELLANEOUS) ×1
CONRAY 43 FOR UROLOGY 50M (MISCELLANEOUS) ×3 IMPLANT
CONT SPEC 4OZ STER OR WHT (MISCELLANEOUS) ×2
CONTAINER SPEC 2.5X3XGRAD LEK (MISCELLANEOUS) ×1 IMPLANT
DRAPE UTILITY 15X26 TOWEL STRL (DRAPES) ×3 IMPLANT
DRSG TEGADERM 2-3/8X2-3/4 SM (GAUZE/BANDAGES/DRESSINGS) ×3 IMPLANT
FIBER LASER LITHO 273 (Laser) ×3 IMPLANT
GLOVE BIO SURGEON STRL SZ 6.5 (GLOVE) ×2 IMPLANT
GLOVE BIO SURGEONS STRL SZ 6.5 (GLOVE) ×1
GOWN STRL REUS W/ TWL LRG LVL3 (GOWN DISPOSABLE) ×2 IMPLANT
GOWN STRL REUS W/TWL LRG LVL3 (GOWN DISPOSABLE) ×4
GUIDEWIRE GREEN .038 145CM (MISCELLANEOUS) IMPLANT
INFUSOR MANOMETER BAG 3000ML (MISCELLANEOUS) ×3 IMPLANT
INTRODUCER DILATOR DOUBLE (INTRODUCER) IMPLANT
KIT RM TURNOVER CYSTO AR (KITS) ×3 IMPLANT
PACK CYSTO AR (MISCELLANEOUS) ×3 IMPLANT
SENSORWIRE 0.038 NOT ANGLED (WIRE) ×6
SET CYSTO W/LG BORE CLAMP LF (SET/KITS/TRAYS/PACK) ×3 IMPLANT
SHEATH URETERAL 12FRX35CM (MISCELLANEOUS) IMPLANT
SOL .9 NS 3000ML IRR  AL (IV SOLUTION) ×2
SOL .9 NS 3000ML IRR UROMATIC (IV SOLUTION) ×1 IMPLANT
STENT URET 6FRX24 CONTOUR (STENTS) IMPLANT
STENT URET 6FRX26 CONTOUR (STENTS) ×3 IMPLANT
SURGILUBE 2OZ TUBE FLIPTOP (MISCELLANEOUS) ×3 IMPLANT
WATER STERILE IRR 1000ML POUR (IV SOLUTION) ×3 IMPLANT
WIRE SENSOR 0.038 NOT ANGLED (WIRE) ×2 IMPLANT

## 2017-08-14 NOTE — Progress Notes (Signed)
Dr. Erlene Quan at bedside pt c/o burning to penis and urgency.  Provider ordered B&O suppository.  Pharmacy reported B&O suppository on backorder and none in hospital.  Provider gave verbal order while at bedside for lidocaine gel.  Pt refused.  PO pain meds admin per PACU order

## 2017-08-14 NOTE — Anesthesia Post-op Follow-up Note (Signed)
Anesthesia QCDR form completed.        

## 2017-08-14 NOTE — H&P (View-Only) (Signed)
08/11/2017 10:52 AM   Nicholas Giovanni Jr. [birth year] [id number]  Referring provider: No referring provider defined for this encounter.  Chief Complaint  Patient presents with  . Nephrolithiasis    New Patient    HPI: 51 year old male with a history of nephrolithiasis with a 6 mm left obstructing ureteral calculus who presents today to discuss management.  He continues to have pain from the stone. He has been seen 2 x over the past 10 days.  Most recently, he was emergency room on 06/09/2017 with severe pain, nausea and vomiting.   Cr mildly elevated to 1.16.  UA negative other than for blood.    Today, he continues to left flank pain. His nausea is improving but still present. He has been taking narcotics continuously and been able to work. No fevers or chills. No dysuria or gross hematuria.  He does have a personal history of stone.  He has required surgical intervention x 2 including URS and EWSL.  His last stone episode was 2013.     PMH: Past Medical History:  Diagnosis Date  . Anxiety   . Chronic kidney disease    kidney stones since age 6  . GERD (gastroesophageal reflux disease)    with tomatoe products  . Hypercholesteremia   . Hypertension   . Sleep apnea    cpap bring machine tested 10-12 yrs ago    Surgical History: Past Surgical History:  Procedure Laterality Date  . BACK SURGERY     Jan 17 2014  . CYSTOSCOPY  2007  . MAXIMUM ACCESS (MAS)POSTERIOR LUMBAR INTERBODY FUSION (PLIF) 1 LEVEL N/A 08/11/2014   Procedure: FOR MAXIMUM ACCESS (MAS) POSTERIOR LUMBAR INTERBODY FUSION (PLIF) LUMBAR FIVE-SACRAL ONE;  Surgeon: Charlie Pitter, MD;  Location: [facility];  Service: Neurosurgery;  Laterality: N/A;  FOR MAXIMUM ACCESS (MAS) POSTERIOR LUMBAR INTERBODY FUSION (PLIF) LUMBAR FIVE-SACRAL ONE    Home Medications:  Allergies as of 08/11/2017      Reactions   Amoxicillin-pot Clavulanate Anaphylaxis   Has patient had a PCN reaction causing immediate rash,  facial/tongue/throat swelling, SOB or lightheadedness with hypotension: Yes Has patient had a PCN reaction causing severe rash involving mucus membranes or skin necrosis: No Has patient had a PCN reaction that required hospitalization: No Has patient had a PCN reaction occurring within the last 10 years: Yes If all of the above answers are "NO", then may proceed with Cephalosporin use.   Clindamycin Other (See Comments)   Hallucinations    Cortisone Acetate [cortisone] Other (See Comments)   Shots caused redness and swelling at injection sites      Medication List       Accurate as of 08/11/17 11:59 PM. Always use your most recent med list.          cyclobenzaprine 10 MG tablet Commonly known as:  FLEXERIL Take 10 mg by mouth 2 (two) times daily as needed for muscle spasms.   ibuprofen 200 MG tablet Commonly known as:  ADVIL,MOTRIN Take 800 mg by mouth daily as needed for moderate pain.   ondansetron 4 MG disintegrating tablet Commonly known as:  ZOFRAN ODT Take 1 tablet (4 mg total) by mouth every 8 (eight) hours as needed for nausea or vomiting.   oxyCODONE-acetaminophen 10-325 MG tablet Commonly known as:  PERCOCET Take 1 tablet by mouth every 8 (eight) hours as needed for pain.   tamsulosin 0.4 MG Caps capsule Commonly known as:  FLOMAX Take 1 capsule (0.4 mg total)  by mouth daily.       Allergies:  Allergies  Allergen Reactions  . Amoxicillin-Pot Clavulanate Anaphylaxis    Has patient had a PCN reaction causing immediate rash, facial/tongue/throat swelling, SOB or lightheadedness with hypotension: Yes Has patient had a PCN reaction causing severe rash involving mucus membranes or skin necrosis: No Has patient had a PCN reaction that required hospitalization: No Has patient had a PCN reaction occurring within the last 10 years: Yes If all of the above answers are "NO", then may proceed with Cephalosporin use.   . Clindamycin Other (See Comments)     Hallucinations   . Cortisone Acetate [Cortisone] Other (See Comments)    Shots caused redness and swelling at injection sites    Family History: Family History  Problem Relation Age of Onset  . Hematuria Mother   . Prostate cancer Father     Social History:  reports that he has been smoking Cigarettes.  He has a 30.00 pack-year smoking history. He has never used smokeless tobacco. He reports that he does not drink alcohol or use drugs.  ROS: UROLOGY Frequent Urination?: No Hard to postpone urination?: No Burning/pain with urination?: No Get up at night to urinate?: No Leakage of urine?: No Urine stream starts and stops?: No Trouble starting stream?: No Do you have to strain to urinate?: No Blood in urine?: Yes Urinary tract infection?: No Sexually transmitted disease?: No Injury to kidneys or bladder?: No Painful intercourse?: No Weak stream?: No Erection problems?: No Penile pain?: No  Gastrointestinal Nausea?: Yes Vomiting?: Yes Indigestion/heartburn?: Yes Diarrhea?: No Constipation?: Yes  Constitutional Fever: No Night sweats?: No Weight loss?: No Fatigue?: No  Skin Skin rash/lesions?: No Itching?: No  Eyes Blurred vision?: No Double vision?: No  Ears/Nose/Throat Sore throat?: No Sinus problems?: No  Hematologic/Lymphatic Swollen glands?: No Easy bruising?: No  Cardiovascular Leg swelling?: No Chest pain?: No  Respiratory Cough?: No Shortness of breath?: No  Endocrine Excessive thirst?: No  Musculoskeletal Back pain?: Yes Joint pain?: No  Neurological Headaches?: No Dizziness?: No  Psychologic Depression?: No Anxiety?: No  Physical Exam: BP 123/79 (BP Location: Left Arm, Patient Position: Sitting, Cuff Size: Large)   Pulse 80   Ht 5' 6"  (1.676 m)   Wt 215 lb 9.6 oz (97.8 kg)   BMI 34.80 kg/m   Constitutional:  Alert and oriented, mild distress.  Accompanied by wife today. HEENT: [place] AT, moist mucus membranes.  Trachea  midline, no masses. Cardiovascular: No clubbing, cyanosis, or edema. Respiratory: Normal respiratory effort, no increased work of breathing. GI: Abdomen is soft, nontender, nondistended, no abdominal masses GU: +L CVA tenderness.   Skin: No rashes, bruises or suspicious lesions. Neurologic: Grossly intact, no focal deficits, moving all 4 extremities. Psychiatric: Normal mood and affect.  Laboratory Data: Lab Results  Component Value Date   WBC 11.4 (H) 08/09/2017   HGB 16.0 08/09/2017   HCT 45.6 08/09/2017   MCV 85.8 08/09/2017   PLT 148 (L) 08/09/2017    Lab Results  Component Value Date   CREATININE 1.16 08/09/2017    Lab Results  Component Value Date   HGBA1C 7.8 (H) 03/15/2013    Urinalysis    Component Value Date/Time   COLORURINE YELLOW (A) 08/09/2017 2107   APPEARANCEUR CLEAR (A) 08/09/2017 2107   APPEARANCEUR Clear 03/14/2013 1247   LABSPEC 1.011 08/09/2017 2107   LABSPEC 1.019 03/14/2013 1247   PHURINE 7.0 08/09/2017 2107   GLUCOSEU NEGATIVE 08/09/2017 2107   GLUCOSEU 50 mg/dL 03/14/2013  [place] (A) 08/09/2017 2107   BILIRUBINUR NEGATIVE 08/09/2017 2107   BILIRUBINUR Negative 03/14/2013 [place] 08/09/2017 2107   PROTEINUR NEGATIVE 08/09/2017 2107   NITRITE NEGATIVE 08/09/2017 2107   LEUKOCYTESUR NEGATIVE 08/09/2017 2107   LEUKOCYTESUR Negative 03/14/2013 1247    Pertinent Imaging: CLINICAL DATA:  Severe left flank pain and nausea since last night. History of nephrolithiasis.  EXAM: CT ABDOMEN AND PELVIS WITHOUT CONTRAST  TECHNIQUE: Multidetector CT imaging of the abdomen and pelvis was performed following the standard protocol without IV contrast.  COMPARISON:  09/08/2012 CT abdomen/pelvis.  FINDINGS: Lower chest: No significant pulmonary nodules or acute consolidative airspace disease.  Hepatobiliary: Normal liver with no liver mass. Normal gallbladder with no radiopaque cholelithiasis. No biliary ductal  dilatation.  Pancreas: Normal, with no mass or duct dilation.  Spleen: Normal size. No mass.  Adrenals/Urinary Tract: Normal adrenals. Nonobstructing 2 mm interpolar right renal stone. No right hydronephrosis. Obstructing 6 x 4 mm left lumbar ureteral stone at the L4 level with moderate left hydroureteronephrosis and asymmetric left perinephric fat stranding. No additional stones in the left renal collecting system or left ureter. Normal caliber right ureter with no right ureteral stones. Simple 1.8 cm renal cyst in the posterior upper left kidney. No additional contour deforming renal lesions. Normal bladder.  Stomach/Bowel: Grossly normal stomach. Normal caliber small bowel with no small bowel wall thickening. Normal appendix. Normal large bowel with no diverticulosis, large bowel wall thickening or pericolonic fat stranding.  Vascular/Lymphatic: Atherosclerotic nonaneurysmal abdominal aorta. No pathologically enlarged lymph nodes in the abdomen or pelvis.  Reproductive: Normal size prostate with nonspecific internal prostatic calcifications.  Other: No pneumoperitoneum, ascites or focal fluid collection. Stable small fat containing bilateral inguinal hernias.  Musculoskeletal: No aggressive appearing focal osseous lesions. Status post bilateral posterior spinal fusion at L5-S1. Moderate thoracolumbar spondylosis.  IMPRESSION: 1. Obstructing 6 x 4 mm left lumbar ureteral stone at the L4 level, with moderate left hydroureteronephrosis . 2. Nonobstructing punctate interpolar right renal stone. No right hydronephrosis. 3.  Aortic Atherosclerosis (ICD10-I70.0). 4. Small stable bilateral fat containing inguinal hernias.   Electronically Signed   By: Ilona Sorrel M.D.   On: 08/03/2017 08:36  CT scan was personally reviewed today with the patient.  Assessment & Plan:    1. Left ureteral stone We discussed various treatment options including ESWL vs.  ureteroscopy, laser lithotripsy, and stent vs continued medical expulsive therapy. We discussed the risks and benefits of both including bleeding, infection, damage to surrounding structures, efficacy with need for possible further intervention, and need for temporary ureteral stent.  He is most interested in URS, LL, stent.  All questions answered.    Plan for OR Monday.    Preop urine culture.    2. Hydronephrosis, left Secondary to #1  3. Right kidney stone Punctate right renal stone- recommend observation   Schedule L URS  Hollice Espy, MD  [facility] [address] [place], [place] [address] [phone]

## 2017-08-14 NOTE — Anesthesia Postprocedure Evaluation (Signed)
Anesthesia Post Note  Patient: Nicholas Greene.  Procedure(s) Performed: Procedure(s) (LRB): CYSTOSCOPY/URETEROSCOPY/HOLMIUM LASER/STENT PLACEMENT (Left)  Patient location during evaluation: PACU Anesthesia Type: General Level of consciousness: awake and alert Pain management: pain level controlled Vital Signs Assessment: post-procedure vital signs reviewed and stable Respiratory status: spontaneous breathing, nonlabored ventilation, respiratory function stable and patient connected to nasal cannula oxygen Cardiovascular status: blood pressure returned to baseline and stable Postop Assessment: no signs of nausea or vomiting Anesthetic complications: no     Last Vitals:  Vitals:   08/14/17 1422 08/14/17 1428  BP: 121/90   Pulse: 68 71  Resp: 16 11  Temp:    SpO2: 99% 95%    Last Pain:  Vitals:   08/14/17 1428  TempSrc:   PainSc: 7                  Joseph K Piscitello

## 2017-08-14 NOTE — Anesthesia Preprocedure Evaluation (Signed)
Anesthesia Evaluation  Patient identified by MRN, date of birth, ID band Patient awake    Reviewed: Allergy & Precautions, H&P , NPO status , Patient's Chart, lab work & pertinent test results  History of Anesthesia Complications Negative for: history of anesthetic complications  Airway Mallampati: III  TM Distance: <3 FB Neck ROM: limited    Dental  (+) Poor Dentition, Chipped   Pulmonary neg shortness of breath, sleep apnea , Current Smoker,           Cardiovascular Exercise Tolerance: Good hypertension, (-) angina(-) Past MI and (-) DOE      Neuro/Psych PSYCHIATRIC DISORDERS Anxiety negative neurological ROS     GI/Hepatic Neg liver ROS, GERD  Medicated and Controlled,  Endo/Other  negative endocrine ROS  Renal/GU Renal disease     Musculoskeletal  (+) Arthritis ,   Abdominal   Peds  Hematology negative hematology ROS (+)   Anesthesia Other Findings Past Medical History: No date: Anxiety No date: Chronic kidney disease     Comment:  kidney stones since age 73 No date: GERD (gastroesophageal reflux disease)     Comment:  with tomatoe products No date: Hypercholesteremia No date: Hypertension No date: Sleep apnea     Comment:  cpap bring machine tested 10-12 yrs ago  Past Surgical History: No date: BACK SURGERY     Comment:  Jan 17 2014 2007: CYSTOSCOPY 08/11/2014: MAXIMUM ACCESS (MAS)POSTERIOR LUMBAR INTERBODY FUSION  (PLIF) 1 LEVEL; N/A     Comment:  Procedure: FOR MAXIMUM ACCESS (MAS) POSTERIOR LUMBAR               INTERBODY FUSION (PLIF) LUMBAR FIVE-SACRAL ONE;  Surgeon:              Charlie Pitter, MD;  Location: [facility];  Service:               Neurosurgery;  Laterality: N/A;  FOR MAXIMUM ACCESS (MAS)              POSTERIOR LUMBAR INTERBODY FUSION (PLIF) LUMBAR               FIVE-SACRAL ONE     Reproductive/Obstetrics negative OB ROS                              Anesthesia Physical Anesthesia Plan  ASA: III  Anesthesia Plan: General LMA   Post-op Pain Management:    Induction: Intravenous  PONV Risk Score and Plan: 3 and Ondansetron, Dexamethasone and Midazolam  Airway Management Planned: LMA  Additional Equipment:   Intra-op Plan:   Post-operative Plan: Extubation in OR  Informed Consent: I have reviewed the patients History and Physical, chart, labs and discussed the procedure including the risks, benefits and alternatives for the proposed anesthesia with the patient or authorized representative who has indicated his/her understanding and acceptance.   Dental Advisory Given  Plan Discussed with: Anesthesiologist, CRNA and Surgeon  Anesthesia Plan Comments: (Patient consented for risks of anesthesia including but not limited to:  - adverse reactions to medications - damage to teeth, lips or other oral mucosa - sore throat or hoarseness - Damage to heart, brain, lungs or loss of life  Patient voiced understanding.)        Anesthesia Quick Evaluation

## 2017-08-14 NOTE — Transfer of Care (Signed)
Immediate Anesthesia Transfer of Care Note  Patient: Nicholas Greene  Procedure(s) Performed: Procedure(s): CYSTOSCOPY/URETEROSCOPY/HOLMIUM LASER/STENT PLACEMENT (Left)  Patient Location: PACU  Anesthesia Type:General  Level of Consciousness: awake, alert  and oriented  Airway & Oxygen Therapy: Patient Spontanous Breathing  Post-op Assessment: Report given to RN, Post -op Vital signs reviewed and stable and Patient moving all extremities X 4  Post vital signs: Reviewed and stable  Last Vitals:  Vitals:   08/14/17 1319 08/14/17 1321  BP: (!) (P) 130/92   Pulse: (P) 93 93  Resp: (P) 14 15  Temp: (P) 36.5 C   SpO2: (P) 98% 98%    Last Pain:  Vitals:   08/14/17 1152  TempSrc: Oral         Complications: No apparent anesthesia complications

## 2017-08-14 NOTE — Discharge Instructions (Signed)
You have a ureteral stent in place.  This is a tube that extends from your kidney to your bladder.  This may cause urinary bleeding, burning with urination, and urinary frequency.  Please call our office or present to the ED if you develop fevers >101 or pain which is not able to be controlled with oral pain medications.  You may be given either Flomax and/ or ditropan to help with bladder spasms and stent pain in addition to pain medications.    Your stent is on a string taped to your penis. Please try to maintain this for 3 days. On the third day, untape and pulled gently until the entire stent is removed.  If you have any questions or concerns, please feel free to call our office. If the stent accidentally is dislodged, remove it, will let us know nonurgently.  This is not an emergency unless you're having severe uncontrolled pain or fevers.  [place] [address] [place], [place] [address] [phone]

## 2017-08-14 NOTE — Telephone Encounter (Signed)
Done   Sharyn Lull

## 2017-08-14 NOTE — Op Note (Signed)
Date of procedure: 08/14/17  Preoperative diagnosis:  1. Left ureteral calculus 2. Left hydronephrosis   Postoperative diagnosis:  1. Same as above   Procedure: 1. Left ureteroscopy with laser lithotripsy 2. Left retrograde pyelogram 3. Basket extraction of Stone fragment 4. Left ureteral stent placement  Surgeon: Hollice Espy, MD  Anesthesia: General  Complications: None  Intraoperative findings: 6 mm stone at approximately the level of the iliacs. All fragments removed via basket.  EBL: minimal  Specimens: none  Drains: 6 x 26 French double-J ureteral stent, string left in place  Indication: Nicholas Greene. is a 51 y.o. patient with 6 mm left ureteral stone and severe pain..  After reviewing the management options for treatment, he elected to proceed with the above surgical procedure(s). We have discussed the potential benefits and risks of the procedure, side effects of the proposed treatment, the likelihood of the patient achieving the goals of the procedure, and any potential problems that might occur during the procedure or recuperation. Informed consent has been obtained.  Description of procedure:  The patient was taken to the operating room and general anesthesia was induced.  The patient was placed in the dorsal lithotomy position, prepped and draped in the usual sterile fashion, and preoperative antibiotics were administered. A preoperative time-out was performed.   A 21 French scope was advanced per urethra into the bladder. Attention was turned to the left ureteral orifice which was cannulated using a 5 [place] open-ended ureteral catheter. A gentle retrograde pyelogram was performed. This revealed no obvious filling defects or severe hydronephrosis. The stone was not seen on scout imaging. Given his continued pain, the decision was made to proceed with ureteroscopy despite this. A wire was put up to level of the kidney.  This was snapped in place as a safety  wire. A semirigid 4.5 ureteroscope was advanced up to level iliacs where the stone was encountered. A 273  laser fiber was then brought in and using settings of 0.8 J and 10 Hz, the stone was fragmented into very small pieces. Each of these pieces were then extracted using a 1.9 [place] to plus nitinol basket. The scope was able to be advanced all the up to level of the proximal ureter and no additional stone debris was identified. The scope was backed down the length of the ureter inspecting along the way. There is minimal edema and trauma.  The safety wire was then backloaded over a rigid cystoscope. A 6 x 26 French double-J ureteral stent was advanced over the wire up to level of the kidney. The wire was partially withdrawn until full coil was noted within the renal pelvis. The wire was then fully withdrawn and a full coil was noted within the bladder. The bladder was then drained. The string was left attached to the distal coil of the stent. The stent string was affixed to the patient's glans using Mastisol and Tegaderm. He was then cleaned and dried, repositioned supine position, reversed from anesthesia, and taken to the PACU in stable condition.  Plan: Patient will remove his own stent in 3 days. He will follow-up in 1 month with renal ultrasound prior.  Hollice Espy, M.D.

## 2017-08-14 NOTE — Interval H&P Note (Signed)
History and Physical Interval Note:  08/14/2017 11:56 AM  Nicholas Greene.  has presented today for surgery, with the diagnosis of left ureteral stone  The various methods of treatment have been discussed with the patient and family. After consideration of risks, benefits and other options for treatment, the patient has consented to  Procedure(s): CYSTOSCOPY/URETEROSCOPY/HOLMIUM LASER/STENT PLACEMENT (Left) as a surgical intervention .  The patient's history has been reviewed, patient examined, no change in status, stable for surgery.  I have reviewed the patient's chart and labs.  Questions were answered to the patient's satisfaction.    [facility] CTAB  Hollice Espy

## 2017-09-08 NOTE — Progress Notes (Deleted)
09/11/2017 8:59 AM   Nicholas Giovanni Jr. [birth year] [id number]  Referring provider: No referring provider defined for this encounter.  No chief complaint on file.   HPI: 51 yo WM who is s/p left URS/LL/ureteral stent placement on 08/14/2017.  Background history 51 year old male with a history of nephrolithiasis with a 6 mm left obstructing ureteral calculus who presents today to discuss management.  He continues to have pain from the stone. He has been seen 2 x over the past 10 days.  Most recently, he was emergency room on 06/09/2017 with severe pain, nausea and vomiting.   Cr mildly elevated to 1.16.  UA negative other than for blood.  Today, he continues to left flank pain. His nausea is improving but still present. He has been taking narcotics continuously and been able to work. No fevers or chills. No dysuria or gross hematuria.  He does have a personal history of stone.  He has required surgical intervention x 2 including URS and EWSL.  His last stone episode was 2013.    He then underwent a left ureteroscopy with laser lithotripsy with ureteral stent placement.   He then removed his own stent three days later.  His postprocedural course was as expected an uneventful.  RUS performed on ***.     Today,    PMH: Past Medical History:  Diagnosis Date  . Anxiety   . Chronic kidney disease    kidney stones since age 12  . GERD (gastroesophageal reflux disease)    with tomatoe products  . Hypercholesteremia   . Hypertension   . Sleep apnea    cpap bring machine tested 10-12 yrs ago    Surgical History: Past Surgical History:  Procedure Laterality Date  . BACK SURGERY     Jan 17 2014  . CYSTOSCOPY  2007  . CYSTOSCOPY/URETEROSCOPY/HOLMIUM LASER/STENT PLACEMENT Left 08/14/2017   Procedure: CYSTOSCOPY/URETEROSCOPY/HOLMIUM LASER/STENT PLACEMENT;  Surgeon: Hollice Espy, MD;  Location: [facility];  Service: Urology;  Laterality: Left;  [place] MAXIMUM ACCESS (MAS)POSTERIOR  LUMBAR INTERBODY FUSION (PLIF) 1 LEVEL N/A 08/11/2014   Procedure: FOR MAXIMUM ACCESS (MAS) POSTERIOR LUMBAR INTERBODY FUSION (PLIF) LUMBAR FIVE-SACRAL ONE;  Surgeon: Charlie Pitter, MD;  Location: [facility];  Service: Neurosurgery;  Laterality: N/A;  FOR MAXIMUM ACCESS (MAS) POSTERIOR LUMBAR INTERBODY FUSION (PLIF) LUMBAR FIVE-SACRAL ONE    Home Medications:  Allergies as of 09/11/2017      Reactions   Amoxicillin-pot Clavulanate Anaphylaxis   Has patient had a PCN reaction causing immediate rash, facial/tongue/throat swelling, SOB or lightheadedness with hypotension: Yes Has patient had a PCN reaction causing severe rash involving mucus membranes or skin necrosis: No Has patient had a PCN reaction that required hospitalization: No Has patient had a PCN reaction occurring within the last 10 years: Yes If all of the above answers are "NO", then may proceed with Cephalosporin use.   Clindamycin Other (See Comments)   Hallucinations    Cortisone Acetate [cortisone] Other (See Comments)   Shots caused redness and swelling at injection sites      Medication List       Accurate as of 09/08/17  8:59 AM. Always use your most recent med list.          cyclobenzaprine 10 MG tablet Commonly known as:  FLEXERIL Take 10 mg by mouth 2 (two) times daily as needed for muscle spasms.   ibuprofen 200 MG tablet Commonly known as:  ADVIL,MOTRIN Take 800 mg by mouth daily as  needed for moderate pain.   ondansetron 4 MG disintegrating tablet Commonly known as:  ZOFRAN ODT Take 1 tablet (4 mg total) by mouth every 8 (eight) hours as needed for nausea or vomiting.   oxybutynin 5 MG tablet Commonly known as:  DITROPAN Take 1 tablet (5 mg total) by mouth every 8 (eight) hours as needed for bladder spasms.   oxyCODONE-acetaminophen 10-325 MG tablet Commonly known as:  PERCOCET Take 1 tablet by mouth every 8 (eight) hours as needed for pain.   tamsulosin 0.4 MG Caps capsule Commonly known as:   FLOMAX Take 1 capsule (0.4 mg total) by mouth daily.       Allergies:  Allergies  Allergen Reactions  . Amoxicillin-Pot Clavulanate Anaphylaxis    Has patient had a PCN reaction causing immediate rash, facial/tongue/throat swelling, SOB or lightheadedness with hypotension: Yes Has patient had a PCN reaction causing severe rash involving mucus membranes or skin necrosis: No Has patient had a PCN reaction that required hospitalization: No Has patient had a PCN reaction occurring within the last 10 years: Yes If all of the above answers are "NO", then may proceed with Cephalosporin use.   . Clindamycin Other (See Comments)    Hallucinations   . Cortisone Acetate [Cortisone] Other (See Comments)    Shots caused redness and swelling at injection sites    Family History: Family History  Problem Relation Age of Onset  . Hematuria Mother   . Prostate cancer Father     Social History:  reports that he has been smoking Cigarettes.  He has a 30.00 pack-year smoking history. He has never used smokeless tobacco. He reports that he does not drink alcohol or use drugs.  ROS:                                        Physical Exam: There were no vitals taken for this visit.  Constitutional:  Alert and oriented, mild distress.  Accompanied by wife today. HEENT: [place] AT, moist mucus membranes.  Trachea midline, no masses. Cardiovascular: No clubbing, cyanosis, or edema. Respiratory: Normal respiratory effort, no increased work of breathing. GI: Abdomen is soft, nontender, nondistended, no abdominal masses GU: +L CVA tenderness.   Skin: No rashes, bruises or suspicious lesions. Neurologic: Grossly intact, no focal deficits, moving all 4 extremities. Psychiatric: Normal mood and affect.  Laboratory Data: Lab Results  Component Value Date   WBC 11.4 (H) 08/09/2017   HGB 16.0 08/09/2017   HCT 45.6 08/09/2017   MCV 85.8 08/09/2017   PLT 148 (L) 08/09/2017    Lab  Results  Component Value Date   CREATININE 1.16 08/09/2017    Urinalysis ***  I have reviewed the labs  Pertinent Imaging: ***  I have independently reviewed the films  Assessment & Plan:    1. Left ureteral stone  - s/p left URS/LL/ureteral stent   - ***  2. Hydronephrosis, left  - RUS demonstrates   3. Right kidney stone  - Punctate right renal stone- recommend observation    Zara Council, PA-C  [facility] [address] [place], [place] [address] [phone]

## 2017-09-11 ENCOUNTER — Encounter: Payer: Self-pay | Admitting: Urology

## 2017-09-11 ENCOUNTER — Ambulatory Visit: Payer: Self-pay | Admitting: Urology

## 2017-09-19 ENCOUNTER — Telehealth: Payer: Self-pay | Admitting: Urology

## 2017-09-19 DIAGNOSIS — N200 Calculus of kidney: Secondary | ICD-10-CM

## 2017-09-19 NOTE — Telephone Encounter (Signed)
Spoke with pt in reference to Nicholas Greene and f/u with [facility]. Pt stated that he forgot about both appts until he got his no show letter. RUS orders were placed and a f/u with [name] was rescheduled.

## 2017-09-19 NOTE — Telephone Encounter (Signed)
Patient no showed for his follow up appt with [name] and never got his [place]

## 2017-09-19 NOTE — Telephone Encounter (Signed)
Please reach out to patient and educate him on why we follow up with renal ultrasound.  Document that he understands risks for not follow up as recommended.    Hollice Espy, MD

## 2017-09-26 ENCOUNTER — Ambulatory Visit
Admission: RE | Admit: 2017-09-26 | Discharge: 2017-09-26 | Disposition: A | Discharge status: Home / Self Care | Payer: Self-pay | Source: Ambulatory Visit | Attending: Urology | Admitting: Urology

## 2017-09-26 DIAGNOSIS — N200 Calculus of kidney: Secondary | ICD-10-CM | POA: Insufficient documentation

## 2017-10-02 NOTE — Progress Notes (Signed)
10/03/2017 3:08 PM   Bynum. 17-Aug-1966 [id number]  Referring provider: No referring provider defined for this encounter.  Chief Complaint  Patient presents with  . Results    RUS    HPI: 51 yo WM who is s/p left URS/LL/ureteral stent placement on 08/14/2017.  Background history 51 year old male with a history of nephrolithiasis with a 6 mm left obstructing ureteral calculus who presents today to discuss management.  He continues to have pain from the stone. He has been seen 2 x over the past 10 days.  Most recently, he was emergency room on 06/09/2017 with severe pain, nausea and vomiting.   Cr mildly elevated to 1.16.  UA negative other than for blood.  Today, he continues to left flank pain. His nausea is improving but still present. He has been taking narcotics continuously and been able to work. No fevers or chills. No dysuria or gross hematuria.  He does have a personal history of stone.  He has required surgical intervention x 2 including URS and EWSL.  His last stone episode was 2013.    He then underwent a left ureteroscopy with laser lithotripsy with ureteral stent placement.   He then removed his own stent three days later.  His postprocedural course was as expected an uneventful.  RUS performed on 09/26/2017 noted mild left-sided pelvicaliectasis, the etiology of which is not depicted on this examination.  Punctate (approximately 0.4 cm) nonobstructing suspected stone within the interpolar aspect of the right kidney, similar to abdominal CT performed 07/2017.  Suspected hepatic steatosis.  Correlation LFTs is recommended.  Today, he is not having flank pain or gross hematuria. He has not passed any fragments since he last seen [place]. He is not experiencing any right upper quadrant pain. He denies any fever, chills, nausea and vomiting. He does not have a PCP.   PMH: Past Medical History:  Diagnosis Date  . Anxiety   . Chronic kidney disease    kidney stones  since age 76  . GERD (gastroesophageal reflux disease)    with tomatoe products  . Hypercholesteremia   . Hypertension   . Sleep apnea    cpap bring machine tested 10-12 yrs ago    Surgical History: Past Surgical History:  Procedure Laterality Date  . BACK SURGERY     Jan 17 2014  . CYSTOSCOPY  2007  . CYSTOSCOPY/URETEROSCOPY/HOLMIUM LASER/STENT PLACEMENT Left 08/14/2017   Procedure: CYSTOSCOPY/URETEROSCOPY/HOLMIUM LASER/STENT PLACEMENT;  Surgeon: Hollice Espy, MD;  Location: [facility] ORS;  Service: Urology;  Laterality: Left;  Marland Kitchen MAXIMUM ACCESS (MAS)POSTERIOR LUMBAR INTERBODY FUSION (PLIF) 1 LEVEL N/A 08/11/2014   Procedure: FOR MAXIMUM ACCESS (MAS) POSTERIOR LUMBAR INTERBODY FUSION (PLIF) LUMBAR FIVE-SACRAL ONE;  Surgeon: Charlie Pitter, MD;  Location: [place] [facility];  Service: Neurosurgery;  Laterality: N/A;  FOR MAXIMUM ACCESS (MAS) POSTERIOR LUMBAR INTERBODY FUSION (PLIF) LUMBAR FIVE-SACRAL ONE    Home Medications:  Allergies as of 10/03/2017      Reactions   Amoxicillin-pot Clavulanate Anaphylaxis   Has patient had a PCN reaction causing immediate rash, facial/tongue/throat swelling, SOB or lightheadedness with hypotension: Yes Has patient had a PCN reaction causing severe rash involving mucus membranes or skin necrosis: No Has patient had a PCN reaction that required hospitalization: No Has patient had a PCN reaction occurring within the last 10 years: Yes If all of the above answers are "NO", then may proceed with Cephalosporin use.   Clindamycin Other (See Comments)   Hallucinations  Cortisone Acetate [cortisone] Other (See Comments)   Shots caused redness and swelling at injection sites      Medication List       Accurate as of 10/03/17  3:08 PM. Always use your most recent med list.          bisoprolol 10 MG tablet Commonly known as:  ZEBETA Take by mouth.   cyclobenzaprine 10 MG tablet Commonly known as:  FLEXERIL Take 10 mg by mouth 2 (two) times daily as  needed for muscle spasms.   diphenhydrAMINE 25 MG tablet Commonly known as:  BENADRYL Take by mouth.   fenofibrate 160 MG tablet Take by mouth.   ibuprofen 200 MG tablet Commonly known as:  ADVIL,MOTRIN Take 800 mg by mouth daily as needed for moderate pain.   ondansetron 4 MG disintegrating tablet Commonly known as:  ZOFRAN ODT Take 1 tablet (4 mg total) by mouth every 8 (eight) hours as needed for nausea or vomiting.   oxybutynin 5 MG tablet Commonly known as:  DITROPAN Take 1 tablet (5 mg total) by mouth every 8 (eight) hours as needed for bladder spasms.   oxyCODONE-acetaminophen 10-325 MG tablet Commonly known as:  PERCOCET Take 1 tablet by mouth every 8 (eight) hours as needed for pain.   tamsulosin 0.4 MG Caps capsule Commonly known as:  FLOMAX Take 1 capsule (0.4 mg total) by mouth daily.       Allergies:  Allergies  Allergen Reactions  . Amoxicillin-Pot Clavulanate Anaphylaxis    Has patient had a PCN reaction causing immediate rash, facial/tongue/throat swelling, SOB or lightheadedness with hypotension: Yes Has patient had a PCN reaction causing severe rash involving mucus membranes or skin necrosis: No Has patient had a PCN reaction that required hospitalization: No Has patient had a PCN reaction occurring within the last 10 years: Yes If all of the above answers are "NO", then may proceed with Cephalosporin use.   . Clindamycin Other (See Comments)    Hallucinations   . Cortisone Acetate [Cortisone] Other (See Comments)    Shots caused redness and swelling at injection sites    Family History: Family History  Problem Relation Age of Onset  . Hematuria Mother   . Prostate cancer Father   . Kidney cancer Neg Hx   . Bladder Cancer Neg Hx     Social History:  reports that he has been smoking Cigarettes.  He has a 30.00 pack-year smoking history. He has never used smokeless tobacco. He reports that he does not drink alcohol or use  drugs.  ROS: UROLOGY Frequent Urination?: No Hard to postpone urination?: No Burning/pain with urination?: No Get up at night to urinate?: No Leakage of urine?: No Urine stream starts and stops?: No Trouble starting stream?: No Do you have to strain to urinate?: No Blood in urine?: No Urinary tract infection?: No Sexually transmitted disease?: No Injury to kidneys or bladder?: No Painful intercourse?: No Weak stream?: No Erection problems?: No Penile pain?: No  Gastrointestinal Nausea?: No Vomiting?: No Indigestion/heartburn?: No Diarrhea?: No Constipation?: No  Constitutional Fever: No Night sweats?: No Weight loss?: No Fatigue?: No  Skin Skin rash/lesions?: No Itching?: No  Eyes Blurred vision?: No Double vision?: No  Ears/Nose/Throat Sore throat?: No Sinus problems?: No  Hematologic/Lymphatic Swollen glands?: No Easy bruising?: No  Cardiovascular Leg swelling?: No Chest pain?: No  Respiratory Cough?: No Shortness of breath?: No  Endocrine Excessive thirst?: No  Musculoskeletal Back pain?: Yes Joint pain?: No  Neurological Headaches?: No Dizziness?:  No  Psychologic Depression?: No Anxiety?: No  Physical Exam: BP 122/78   Pulse 70   Ht 5' 6"  (1.676 m)   Wt 213 lb 3.2 oz (96.7 kg)   BMI 34.41 kg/m   Constitutional:  Alert and oriented, mild distress.  Accompanied by wife today. HEENT: [place] AT, moist mucus membranes.  Trachea midline, no masses. Cardiovascular: No clubbing, cyanosis, or edema. Respiratory: Normal respiratory effort, no increased work of breathing. GI: Abdomen is soft, nontender, nondistended, no abdominal masses GU: +L CVA tenderness.   Skin: No rashes, bruises or suspicious lesions. Neurologic: Grossly intact, no focal deficits, moving all 4 extremities. Psychiatric: Normal mood and affect.  Laboratory Data: Lab Results  Component Value Date   WBC 11.4 (H) 08/09/2017   HGB 16.0 08/09/2017   HCT 45.6  08/09/2017   MCV 85.8 08/09/2017   PLT 148 (L) 08/09/2017    Lab Results  Component Value Date   CREATININE 1.16 08/09/2017     I have reviewed the labs  Pertinent Imaging: CLINICAL DATA:  Kidney stone.  Post cystoscopy on 08/14/2017.  EXAM: RENAL / URINARY TRACT ULTRASOUND COMPLETE  COMPARISON:  08/03/2017  FINDINGS: Right Kidney:  Normal cortical thickness, echogenicity and size, measuring 13.1 cm in length. No focal renal lesions. Note is made of a punctate (approximately 0.4 cm) echogenic nonobstructing stone within the interpolar aspect of the right kidney (image 22). No urinary obstruction.  Left Kidney:  Normal cortical thickness, echogenicity and size, measuring 15.4 cm in length. Note is made of a punctate (approximately 2.0 x 1.3 x 1.7 cm anechoic cyst within the interpolar aspect the left kidney. No echogenic renal stones. Note is made of mild left-sided pelvicaliectasis (representative images 28 and 44), the etiology of which is not depicted on this examination.  Bladder:  Appears normal for degree of bladder distention. No significant postvoid residual (pre voiding volume - 579 cc, post voiding volume - 16 cc). Bilateral ureteral jets are identified (image 57).  There is diffuse increased echogenicity of the hepatic parenchyma suggestive of hepatic steatosis (image 8)  IMPRESSION: 1. Mild left-sided pelvicaliectasis, the etiology of which is not depicted on this examination. 2. Punctate (approximately 0.4 cm) nonobstructing suspected stone within the interpolar aspect of the right kidney, similar to abdominal CT performed 07/2017. 3. Suspected hepatic steatosis.  Correlation LFTs is recommended.   Electronically Signed   By: Sandi Mariscal M.D.   On: 09/27/2017 13:19   I have independently reviewed the films  Assessment & Plan:    1. Left ureteral stone  - s/p left URS/LL/ureteral stent   - Pelvic caliectasis noted on recent  ultrasound- will obtain a noncontrast CT for further evaluation -CMP today  - Advised to contact our office or seek treatment in the ED if becomes febrile or pain/ vomiting are difficult control in order to arrange for emergent/urgent intervention  2. Hydronephrosis, left  - RUS demonstrates pelvic caliectasis - CT scan pending  3. Right kidney stone  - Punctate right renal stone- recommend observation  4. Hepatic steatosis  - CMP today    Zara Council, PA-C  [facility] [address], [place], [place] [address] [phone]

## 2017-10-03 ENCOUNTER — Encounter: Payer: Self-pay | Admitting: Urology

## 2017-10-03 ENCOUNTER — Ambulatory Visit (INDEPENDENT_AMBULATORY_CARE_PROVIDER_SITE_OTHER): Payer: Self-pay | Admitting: Urology

## 2017-10-03 VITALS — BP 122/78 | HR 70 | Ht 66.0 in | Wt 213.2 lb

## 2017-10-03 DIAGNOSIS — N1330 Unspecified hydronephrosis: Secondary | ICD-10-CM

## 2017-10-03 DIAGNOSIS — K760 Fatty (change of) liver, not elsewhere classified: Secondary | ICD-10-CM

## 2017-10-03 DIAGNOSIS — N1339 Other hydronephrosis: Secondary | ICD-10-CM

## 2017-10-03 DIAGNOSIS — N200 Calculus of kidney: Secondary | ICD-10-CM

## 2017-10-03 DIAGNOSIS — N201 Calculus of ureter: Secondary | ICD-10-CM

## 2017-10-04 ENCOUNTER — Telehealth: Payer: Self-pay

## 2017-10-04 LAB — COMPREHENSIVE METABOLIC PANEL
A/G RATIO: 2 (ref 1.2–2.2)
ALBUMIN: 4.6 g/dL (ref 3.5–5.5)
ALT: 34 IU/L (ref 0–44)
AST: 19 IU/L (ref 0–40)
Alkaline Phosphatase: 72 IU/L (ref 39–117)
BUN / CREAT RATIO: 21 — AB (ref 9–20)
BUN: 18 mg/dL (ref 6–24)
Bilirubin Total: 0.3 mg/dL (ref 0.0–1.2)
CALCIUM: 9.8 mg/dL (ref 8.7–10.2)
CO2: 25 mmol/L (ref 20–29)
CREATININE: 0.87 mg/dL (ref 0.76–1.27)
Chloride: 103 mmol/L (ref 96–106)
GFR, EST AFRICAN AMERICAN: 118 mL/min/{1.73_m2} (ref >59)
GFR, EST NON AFRICAN AMERICAN: 102 mL/min/{1.73_m2} (ref >59)
GLOBULIN, TOTAL: 2.3 g/dL (ref 1.5–4.5)
Glucose: 89 mg/dL (ref 65–99)
POTASSIUM: 4.7 mmol/L (ref 3.5–5.2)
SODIUM: 143 mmol/L (ref 134–144)
TOTAL PROTEIN: 6.9 g/dL (ref 6.0–8.5)

## 2017-10-04 NOTE — Telephone Encounter (Signed)
-----   Message from Nori Riis, PA-C sent at 10/04/2017  7:47 AM EDT ----- Please let Lyndel Pleasure know that his kidney function and liver function tests are normal.

## 2017-10-04 NOTE — Telephone Encounter (Signed)
Spoke with pt in reference to lab results. Pt voiced understanding.  

## 2017-11-23 ENCOUNTER — Telehealth: Payer: Self-pay | Admitting: Urology

## 2017-11-23 NOTE — Telephone Encounter (Signed)
They have been calling this patient since October to schedule his CT scan and he will not return the call.   Sharyn Lull

## 2018-02-22 ENCOUNTER — Encounter: Payer: Self-pay | Admitting: Urology

## 2018-02-22 NOTE — Telephone Encounter (Signed)
We are sending a certified letter for patient to schedule a RUS.

## 2018-03-01 ENCOUNTER — Telehealth: Payer: Self-pay | Admitting: Urology

## 2018-03-01 NOTE — Telephone Encounter (Signed)
I got a call from patient and he got out letter, he is waiting on his disability to kick in so he can get insurance. Once this happens he will call us back to schedule his CT scan. He said that's why he hasn't been able to get it done yet. But he will get it done as soon as he get's insurance.  Sharyn Lull

## 2018-09-26 ENCOUNTER — Encounter

## 2018-09-26 ENCOUNTER — Encounter: Payer: Self-pay | Admitting: Family Medicine

## 2018-09-26 ENCOUNTER — Ambulatory Visit: Payer: 59 | Admitting: Family Medicine

## 2018-09-26 VITALS — BP 124/76 | HR 66 | Temp 98.1°F | Ht 66.0 in | Wt 220.0 lb

## 2018-09-26 DIAGNOSIS — Z0000 Encounter for general adult medical examination without abnormal findings: Secondary | ICD-10-CM

## 2018-09-26 DIAGNOSIS — G4730 Sleep apnea, unspecified: Secondary | ICD-10-CM

## 2018-09-26 DIAGNOSIS — E785 Hyperlipidemia, unspecified: Secondary | ICD-10-CM | POA: Insufficient documentation

## 2018-09-26 DIAGNOSIS — I10 Essential (primary) hypertension: Secondary | ICD-10-CM

## 2018-09-26 DIAGNOSIS — Z23 Encounter for immunization: Secondary | ICD-10-CM | POA: Diagnosis not present

## 2018-09-26 DIAGNOSIS — M961 Postlaminectomy syndrome, not elsewhere classified: Secondary | ICD-10-CM | POA: Diagnosis not present

## 2018-09-26 DIAGNOSIS — Z5181 Encounter for therapeutic drug level monitoring: Secondary | ICD-10-CM

## 2018-09-26 DIAGNOSIS — R634 Abnormal weight loss: Secondary | ICD-10-CM

## 2018-09-26 DIAGNOSIS — E782 Mixed hyperlipidemia: Secondary | ICD-10-CM

## 2018-09-26 DIAGNOSIS — Z720 Tobacco use: Secondary | ICD-10-CM

## 2018-09-26 LAB — CBC WITH DIFFERENTIAL/PLATELET
Basophils Absolute: 70 cells/uL (ref 0–200)
Basophils Relative: 0.7 %
EOS PCT: 1.5 %
Eosinophils Absolute: 150 cells/uL (ref 15–500)
HCT: 48.8 % (ref 38.5–50.0)
Hemoglobin: 17.1 g/dL (ref 13.2–17.1)
Lymphs Abs: 3030 cells/uL (ref 850–3900)
MCH: 29.6 pg (ref 27.0–33.0)
MCHC: 35 g/dL (ref 32.0–36.0)
MCV: 84.6 fL (ref 80.0–100.0)
MONOS PCT: 6.5 %
MPV: 11.3 fL (ref 7.5–12.5)
Neutro Abs: 6100 cells/uL (ref 1500–7800)
Neutrophils Relative %: 61 %
PLATELETS: 182 10*3/uL (ref 140–400)
RBC: 5.77 10*6/uL (ref 4.20–5.80)
RDW: 13.8 % (ref 11.0–15.0)
TOTAL LYMPHOCYTE: 30.3 %
WBC mixed population: 650 cells/uL (ref 200–950)
WBC: 10 10*3/uL (ref 3.8–10.8)

## 2018-09-26 LAB — COMPLETE METABOLIC PANEL WITH GFR
AG RATIO: 2.1 (calc) (ref 1.0–2.5)
ALT: 32 U/L (ref 9–46)
AST: 20 U/L (ref 10–40)
Albumin: 4.6 g/dL (ref 3.6–5.1)
Alkaline phosphatase (APISO): 66 U/L (ref 40–115)
BUN: 13 mg/dL (ref 7–25)
CALCIUM: 9.4 mg/dL (ref 8.6–10.3)
CO2: 26 mmol/L (ref 20–32)
Chloride: 103 mmol/L (ref 98–110)
Creat: 0.84 mg/dL (ref 0.60–1.35)
GFR, EST NON AFRICAN AMERICAN: 103 mL/min/{1.73_m2} (ref >=60)
GFR, Est African American: 119 mL/min/{1.73_m2} (ref >=60)
Globulin: 2.2 g/dL (calc) (ref 1.9–3.7)
Glucose, Bld: 91 mg/dL (ref 65–139)
POTASSIUM: 4.2 mmol/L (ref 3.5–5.3)
Sodium: 136 mmol/L (ref 135–146)
Total Bilirubin: 0.5 mg/dL (ref 0.2–1.2)
Total Protein: 6.8 g/dL (ref 6.1–8.1)

## 2018-09-26 LAB — LIPID PANEL
Cholesterol: 248 mg/dL — ABNORMAL HIGH (ref <200)
HDL: 49 mg/dL (ref >40)
LDL Cholesterol (Calc): 161 mg/dL (calc) — ABNORMAL HIGH
NON-HDL CHOLESTEROL (CALC): 199 mg/dL — AB (ref <130)
TRIGLYCERIDES: 211 mg/dL — AB (ref <150)
Total CHOL/HDL Ratio: 5.1 (calc) — ABNORMAL HIGH (ref <5.0)

## 2018-09-26 LAB — TSH: TSH: 1.47 mIU/L (ref 0.40–4.50)

## 2018-09-26 MED ORDER — DULOXETINE HCL 30 MG PO CPEP
ORAL_CAPSULE | ORAL | 0 refills | Status: DC
Start: 2018-09-26 — End: 2018-10-30

## 2018-09-26 NOTE — Assessment & Plan Note (Addendum)
Encouraged DASH guidelines; controlled with weight loss and better eating

## 2018-09-26 NOTE — Progress Notes (Signed)
BP 124/76   Pulse 66   Temp 98.1 F (36.7 C)   Ht 5' 6"  (1.676 m)   Wt 220 lb (99.8 kg)   SpO2 98%   BMI 35.51 kg/m    Subjective:    Patient ID: Nicholas Greene., male    DOB: [birth year], 52 y.o.   MRN: [id number]  HPI: Nicholas Summerlin. is a 52 y.o. male  Chief Complaint  Patient presents with  . New Patient (Initial Visit)  . Medication Refill    HPI  Here to establish care  He used to take medicine for high blood pressure; has not taken any for a long time; BP normal at Dr. 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 Annette Stable  Obesity; he has lost 68 pounds  Tobacco abuse; he has dramatically cut down on smoking; cannot take Chantix; wellbutrin did not work; smoking 1/2 ppd; at least 2 ppd before at most; he does not drink really, just a beer with dinner now occasionally  Kidney stones; last bout six months ago  Depression screen [facility] 2/9 09/26/2018  Decreased Interest 3  Down, Depressed, Hopeless 3  PHQ - 2 Score 6  Altered sleeping 3  Tired, decreased energy 3  Change in  appetite 0  Feeling bad or failure about yourself  3  Trouble concentrating 0  Moving slowly or fidgety/restless 0  Suicidal thoughts 0  PHQ-9 Score 15  Difficult doing work/chores Not difficult at all   Fall Risk  09/26/2018  Falls in the past year? Yes  Number falls in past yr: 2 or more  Injury with Fall? No  falls related to musculoskeletal issues; knows he has arthritis Declined PT  Relevant past medical, surgical, family and social history reviewed Past Medical History:  Diagnosis Date  . Anxiety   . Chronic kidney disease    kidney stones since age 60  . GERD (gastroesophageal reflux disease)    with tomatoe products  . Hypercholesteremia   . Hypertension   . Sleep apnea    cpap bring machine tested 10-12 yrs ago   Past Surgical History:  Procedure Laterality Date  . BACK SURGERY     Jan 17 2014  . CYSTOSCOPY  2007  . CYSTOSCOPY/URETEROSCOPY/HOLMIUM LASER/STENT PLACEMENT Left 08/14/2017   Procedure: CYSTOSCOPY/URETEROSCOPY/HOLMIUM LASER/STENT PLACEMENT;  Surgeon: Hollice Espy, MD;  Location: [facility] OR[facility];  Service: Urology;  Laterality: Left;  Marland Kitchen MAXIMUM ACCESS (MAS)POSTERIOR LUMBAR INTERBODY FUSION (PLIF) 1 LEVEL N/A 08/11/2014  Procedure: FOR MAXIMUM ACCESS (MAS) POSTERIOR LUMBAR INTERBODY FUSION (PLIF) LUMBAR FIVE-SACRAL ONE;  Surgeon: Charlie Pitter, MD;  Location: [facility];  Service: Neurosurgery;  Laterality: N/A;  FOR MAXIMUM ACCESS (MAS) POSTERIOR LUMBAR INTERBODY FUSION (PLIF) LUMBAR FIVE-SACRAL ONE   Family History  Problem Relation Age of Onset  . Hematuria Mother   . Lung cancer Mother   . Prostate cancer Father   . Kidney cancer Neg Hx   . Bladder Cancer Neg Hx    Social History   Tobacco Use  . Smoking status: Current Every Day Smoker    Packs/day: 0.50    Years: 30.00    Pack years: 15.00    Types: Cigarettes  . Smokeless tobacco: Never Used  Substance Use Topics  . Alcohol use: No    Comment: occassionally but none since January  . Drug  use: Yes    Types: Marijuana     [facility] Visit from 09/26/2018 in [facility]  AUDIT-C Score  1      Interim medical history since last visit reviewed. Allergies and medications reviewed  Review of Systems Per HPI unless specifically indicated above     Objective:    BP 124/76   Pulse 66   Temp 98.1 F (36.7 C)   Ht 5' 6"  (1.676 m)   Wt 220 lb (99.8 kg)   SpO2 98%   BMI 35.51 kg/m   Wt Readings from Last 3 Encounters:  09/27/18 224 lb (101.6 kg)  09/26/18 220 lb (99.8 kg)  10/03/17 213 lb 3.2 oz (96.7 kg)    Physical Exam  Constitutional: He appears well-developed and well-nourished. No distress.  obese  HENT:  Head: Normocephalic and atraumatic.  Eyes: EOM are normal. No scleral icterus.  Neck: No thyromegaly present.  Cardiovascular: Normal rate and regular rhythm.  Pulmonary/Chest: Effort normal and breath sounds normal.  Abdominal: Soft. Bowel sounds are normal. He exhibits no distension.  Musculoskeletal: He exhibits no edema.  Neurological: Coordination normal.  Skin: Skin is warm and dry. No pallor.  Psychiatric: He has a normal mood and affect. His behavior is normal. Judgment and thought content normal. His mood appears not anxious. He does not exhibit a depressed mood.  Good eye contact with examiner      Assessment & Plan:   Problem List Items Addressed This Visit      Cardiovascular and Mediastinum   Essential hypertension, benign    Encouraged DASH guidelines; controlled with weight loss and better eating        Respiratory   Sleep apnea treated with nocturnal BiPAP    Refer to Dr. Ashby Dawes      Relevant Orders   Ambulatory referral to Pulmonology     Other   Tobacco abuse    Encouraged cessation      Morbid obesity ([place])    BMI > 35 with HTN and high cholesterol and sleep apnea      Hyperlipidemia    Encouraged diet low in saturated fats; check lipids      Failed back surgical syndrome - Primary     Patient has had two back surgeries, continues to have pain; offered referral to pain clinic       Other Visit Diagnoses    Need for tetanus, diphtheria, and acellular pertussis (Tdap) vaccine       Relevant Orders   Tdap vaccine greater than or equal to 7yo IM (Completed)   Need for influenza vaccination  Relevant Orders   Flu Vaccine QUAD 6+ mos PF IM (Fluarix Quad PF) (Completed)   Weight loss       Medication monitoring encounter       Preventative health care       Relevant Orders   CBC with Differential/Platelet (Completed)   COMPLETE METABOLIC PANEL WITH GFR (Completed)   Lipid panel (Completed)   TSH (Completed)       Follow up plan: Return in about 3 weeks (around 10/17/2018) for complete physical.  An after-visit summary was printed and given to the patient at [facility].  Please see the patient instructions which may contain other information and recommendations beyond what is mentioned above in the assessment and plan.  Meds ordered this encounter  Medications  . DULoxetine (CYMBALTA) 30 MG capsule    Sig: One by mouth daily for one week, then two a day    Dispense:  53 capsule    Refill:  0    Orders Placed This Encounter  Procedures  . Tdap vaccine greater than or equal to 7yo IM  . Flu Vaccine QUAD 6+ mos PF IM (Fluarix Quad PF)  . CBC with Differential/Platelet  . COMPLETE METABOLIC PANEL WITH GFR  . Lipid panel  . TSH  . Ambulatory referral to Pulmonology

## 2018-09-26 NOTE — Patient Instructions (Addendum)
Try to follow the DASH guidelines (DASH stands for Dietary Approaches to Stop Hypertension). Try to limit the sodium in your diet to no more than 1,576m of sodium per day. Certainly try to not exceed 2,000 mg per day at the very most. Do not add salt when cooking or at the table.  Check the sodium amount on labels when shopping, and choose items lower in sodium when given a choice. Avoid or limit foods that already contain a lot of sodium. Eat a diet rich in fruits and vegetables and whole grains, and try to lose weight if overweight or obese  Check out the information at familydoctor.org entitled "Nutrition for Weight Loss: What You Need to Know about Fad Diets" Try to lose between 1-2 pounds per week by taking in fewer calories and burning off more calories You can succeed by limiting portions, limiting foods dense in calories and fat, becoming more active, and drinking 8 glasses of water a day (64 ounces) Don't skip meals, especially breakfast, as skipping meals may alter your metabolism Do not use over-the-counter weight loss pills or gimmicks that claim rapid weight loss A healthy BMI (or body mass index) is between 18.5 and 24.9 You can calculate your ideal BMI at the NAtwoodwebsite hClubMonetize.fr I do encourage you to quit smoking Call 3863-234-2376to sign up for smoking cessation classes You can call 1-800-QUIT-NOW to talk with a smoking cessation coach  Try to limit saturated fats in your diet (bologna, hot dogs, barbeque, cheeseburgers, hamburgers, steak, bacon, sausage, cheese, etc.) and get more fresh fruits, vegetables, and whole grains  DASH Eating Plan DASH stands for "Dietary Approaches to Stop Hypertension." The DASH eating plan is a healthy eating plan that has been shown to reduce high blood pressure (hypertension). It may also reduce your risk for type 2 diabetes, heart disease, and stroke. The DASH eating plan may also help  with weight loss. What are tips for following this plan? General guidelines  Avoid eating more than 2,300 mg (milligrams) of salt (sodium) a day. If you have hypertension, you may need to reduce your sodium intake to 1,500 mg a day.  Limit alcohol intake to no more than 1 drink a day for nonpregnant women and 2 drinks a day for men. One drink equals 12 oz of beer, 5 oz of wine, or 1 oz of hard liquor.  Work with your health care provider to maintain a healthy body weight or to lose weight. Ask what an ideal weight is for you.  Get at least 30 minutes of exercise that causes your heart to beat faster (aerobic exercise) most days of the week. Activities may include walking, swimming, or biking.  Work with your health care provider or diet and nutrition specialist (dietitian) to adjust your eating plan to your individual calorie needs. Reading food labels  Check food labels for the amount of sodium per serving. Choose foods with less than 5 percent of the Daily Value of sodium. Generally, foods with less than 300 mg of sodium per serving fit into this eating plan.  To find whole grains, look for the word "whole" as the first word in the ingredient list. Shopping  Buy products labeled as "low-sodium" or "no salt added."  Buy fresh foods. Avoid canned foods and premade or frozen meals. Cooking  Avoid adding salt when cooking. Use salt-free seasonings or herbs instead of table salt or sea salt. Check with your health care provider or pharmacist before using salt substitutes.  Do not fry foods. Cook foods using healthy methods such as baking, boiling, grilling, and broiling instead.  Cook with heart-healthy oils, such as olive, canola, soybean, or sunflower oil. Meal planning   Eat a balanced diet that includes: ? 5 or more servings of fruits and vegetables each day. At each meal, try to fill half of your plate with fruits and vegetables. ? Up to 6-8 servings of whole grains each  day. ? Less than 6 oz of lean meat, poultry, or fish each day. A 3-oz serving of meat is about the same size as a deck of cards. One egg equals 1 oz. ? 2 servings of low-fat dairy each day. ? A serving of nuts, seeds, or beans 5 times each week. ? Heart-healthy fats. Healthy fats called Omega-3 fatty acids are found in foods such as flaxseeds and coldwater fish, like sardines, salmon, and mackerel.  Limit how much you eat of the following: ? Canned or prepackaged foods. ? Food that is high in trans fat, such as fried foods. ? Food that is high in saturated fat, such as fatty meat. ? Sweets, desserts, sugary drinks, and other foods with added sugar. ? Full-fat dairy products.  Do not salt foods before eating.  Try to eat at least 2 vegetarian meals each week.  Eat more home-cooked food and less restaurant, buffet, and fast food.  When eating at a restaurant, ask that your food be prepared with less salt or no salt, if possible. What foods are recommended? The items listed may not be a complete list. Talk with your dietitian about what dietary choices are best for you. Grains Whole-grain or whole-wheat bread. Whole-grain or whole-wheat pasta. Brown rice. [place]. Bulgur. Whole-grain and low-sodium cereals. Pita bread. Low-fat, low-sodium crackers. Whole-wheat flour tortillas. Vegetables Fresh or frozen vegetables (raw, steamed, roasted, or grilled). Low-sodium or reduced-sodium tomato and vegetable juice. Low-sodium or reduced-sodium tomato sauce and tomato paste. Low-sodium or reduced-sodium canned vegetables. Fruits All fresh, dried, or frozen fruit. Canned fruit in natural juice (without added sugar). Meat and other protein foods Skinless chicken or [place]. Ground chicken or [place]. Pork with fat trimmed off. Fish and seafood. Egg whites. Dried beans, peas, or lentils. Unsalted nuts, nut butters, and seeds. Unsalted canned beans. Lean cuts of beef with fat trimmed off.  Low-sodium, lean deli meat. Dairy Low-fat (1%) or fat-free (skim) milk. Fat-free, low-fat, or reduced-fat cheeses. Nonfat, low-sodium ricotta or cottage cheese. Low-fat or nonfat yogurt. Low-fat, low-sodium cheese. Fats and oils Soft margarine without trans fats. Vegetable oil. Low-fat, reduced-fat, or light mayonnaise and salad dressings (reduced-sodium). Canola, safflower, olive, soybean, and sunflower oils. Avocado. Seasoning and other foods Herbs. Spices. Seasoning mixes without salt. Unsalted popcorn and pretzels. Fat-free sweets. What foods are not recommended? The items listed may not be a complete list. Talk with your dietitian about what dietary choices are best for you. Grains Baked goods made with fat, such as croissants, muffins, or some breads. Dry pasta or rice meal packs. Vegetables Creamed or fried vegetables. Vegetables in a cheese sauce. Regular canned vegetables (not low-sodium or reduced-sodium). Regular canned tomato sauce and paste (not low-sodium or reduced-sodium). Regular tomato and vegetable juice (not low-sodium or reduced-sodium). Angie Fava. Olives. Fruits Canned fruit in a light or heavy syrup. Fried fruit. Fruit in cream or butter sauce. Meat and other protein foods Fatty cuts of meat. Ribs. Fried meat. Berniece Salines. Sausage. Bologna and other processed lunch meats. Salami. Fatback. Hotdogs. Bratwurst. Salted nuts and seeds. Canned beans  with added salt. Canned or smoked fish. Whole eggs or egg yolks. Chicken or [place] with skin. Dairy Whole or 2% milk, cream, and half-and-half. Whole or full-fat cream cheese. Whole-fat or sweetened yogurt. Full-fat cheese. Nondairy creamers. Whipped toppings. Processed cheese and cheese spreads. Fats and oils Butter. Stick margarine. Lard. Shortening. Ghee. Bacon fat. Tropical oils, such as coconut, palm kernel, or palm oil. Seasoning and other foods Salted popcorn and pretzels. Onion salt, garlic salt, seasoned salt, table salt, and sea  salt. Worcestershire sauce. Tartar sauce. Barbecue sauce. Teriyaki sauce. Soy sauce, including reduced-sodium. Steak sauce. Canned and packaged gravies. Fish sauce. Oyster sauce. Cocktail sauce. Horseradish that you find on the shelf. Ketchup. Mustard. Meat flavorings and tenderizers. Bouillon cubes. Hot sauce and Tabasco sauce. Premade or packaged marinades. Premade or packaged taco seasonings. Relishes. Regular salad dressings. Where to find more information:  National Heart, Lung, and [place]: https://wilson-eaton.com/  American Heart Association: www.heart.org Summary  The DASH eating plan is a healthy eating plan that has been shown to reduce high blood pressure (hypertension). It may also reduce your risk for type 2 diabetes, heart disease, and stroke.  With the DASH eating plan, you should limit salt (sodium) intake to 2,300 mg a day. If you have hypertension, you may need to reduce your sodium intake to 1,500 mg a day.  When on the DASH eating plan, aim to eat more fresh fruits and vegetables, whole grains, lean proteins, low-fat dairy, and heart-healthy fats.  Work with your health care provider or diet and nutrition specialist (dietitian) to adjust your eating plan to your individual calorie needs. This information is not intended to replace advice given to you by your health care provider. Make sure you discuss any questions you have with your health care provider. Document Released: 11/24/2011 Document Revised: 11/28/2016 Document Reviewed: 11/28/2016 Elsevier Interactive Patient Education  [employer].

## 2018-09-26 NOTE — Assessment & Plan Note (Signed)
Encouraged diet low in saturated fats; check lipids

## 2018-09-26 NOTE — Assessment & Plan Note (Signed)
Refer to Dr. Ashby Dawes

## 2018-09-26 NOTE — Assessment & Plan Note (Signed)
Encouraged cessation.

## 2018-09-26 NOTE — Assessment & Plan Note (Signed)
Patient has had two back surgeries, continues to have pain; offered referral to pain clinic

## 2018-09-26 NOTE — Assessment & Plan Note (Signed)
BMI > 35 with HTN and high cholesterol and sleep apnea

## 2018-09-27 ENCOUNTER — Encounter: Payer: Self-pay | Admitting: Internal Medicine

## 2018-09-27 ENCOUNTER — Other Ambulatory Visit: Payer: Self-pay | Admitting: Family Medicine

## 2018-09-27 ENCOUNTER — Ambulatory Visit (INDEPENDENT_AMBULATORY_CARE_PROVIDER_SITE_OTHER): Payer: 59 | Admitting: Internal Medicine

## 2018-09-27 VITALS — BP 122/80 | HR 80 | Resp 16 | Ht 66.0 in | Wt 224.0 lb

## 2018-09-27 DIAGNOSIS — G4733 Obstructive sleep apnea (adult) (pediatric): Secondary | ICD-10-CM

## 2018-09-27 DIAGNOSIS — E782 Mixed hyperlipidemia: Secondary | ICD-10-CM

## 2018-09-27 MED ORDER — ATORVASTATIN CALCIUM 10 MG PO TABS: 10.0000 mg | ORAL_TABLET | Freq: Every day | ORAL | 1 refills | Status: DC

## 2018-09-27 NOTE — Patient Instructions (Signed)
Will send for sleep study.    Sleep Apnea    Sleep apnea is disorder that affects a person's sleep. A person with sleep apnea has abnormal pauses in their breathing when they sleep. It is hard for them to get a good sleep. This makes a person tired during the day. It also can lead to other physical problems. There are three types of sleep apnea. One type is when breathing stops for a short time because your airway is blocked (obstructive sleep apnea). Another type is when the brain sometimes fails to give the normal signal to breathe to the muscles that control your breathing (central sleep apnea). The third type is a combination of the other two types.  HOME CARE   Take all medicine as told by your doctor.  Avoid alcohol, calming medicines (sedatives), and depressant drugs.  Try to lose weight if you are overweight. Talk to your doctor about a healthy weight goal.  Your doctor may have you use a device that helps to open your airway. It can help you get the air that you need. It is called a positive airway pressure (PAP) device.   MAKE SURE YOU:   Understand these instructions.  Will watch your condition.  Will get help right away if you are not doing well or get worse.  It may take approximately 1 month for you to get used to wearing her CPAP every night.  Be sure to work with your machine to get used to it, be patient, it may take time!  If you have trouble tolerating CPAP DO NOT RETURN YOUR MACHINE; Contact our office to see if we can help you tolerate the CPAP better first!

## 2018-09-27 NOTE — Progress Notes (Signed)
[facility] Consultation      Assessment and Plan:  Excessive daytime sleepiness, history of severe obstructive sleep apnea. - Symptoms and signs of obstructive sleep apnea, with history of severe sleep apnea in the past treated with BiPAP at high pressure 28/22. - We will refer for in lab split-night sleep study.  BiPAP titration if needed.  Chronic back pain. - On chronic narcotic medication.  Patient may therefore not qualify for home sleep test, may require a in lab sleep study.  Orders Placed This Encounter  Procedures  . Split night study     Date: 09/27/2018  MRN# [id number] Ah Bott. June 12, 1966   Nicholas Greene. is a 52 y.o. old male seen in consultation for chief complaint of:    Chief Complaint  Patient presents with  . Consult    referred by Dr. Lana Fish for eval of Sleep apnea:  . Snoring  . excessive daytime sleepiness    HPI:   The patient is a 52 year old male, with a previous diagnosis of obstructive sleep apnea managed with Bipap at 28/22. He lost his insurance about 4-5 years ago after he had an injury. About 2-3 years ago his machine broke, and he has been without bipap since that time. He is very tired during the day.  He wakes at 3-4 am, falls back to sleep later during the day. He snores loudly at night. He has lost 60 pounds since he first started on bipap due to multiple surgeries.  Denies cataplexy, sleep walking, teeth grinding.  Denies jaw pain, no dentures, no TMJ.    Review of old records: the patient has sleep apnea, was previously placed on BiPAP at a pressure of 28/22 per order on 11/07/07. Patient returns today with symptoms of excessive daytime sleepiness.  Epworth score is 14.   PMHX:   Past Medical History:  Diagnosis Date  . Anxiety   . Chronic kidney disease    kidney stones since age 18  . GERD (gastroesophageal reflux disease)    with tomatoe products  . Hypercholesteremia   . Hypertension    . Sleep apnea    cpap bring machine tested 10-12 yrs ago   Surgical Hx:  Past Surgical History:  Procedure Laterality Date  . BACK SURGERY     Jan 17 2014  . CYSTOSCOPY  2007  . CYSTOSCOPY/URETEROSCOPY/HOLMIUM LASER/STENT PLACEMENT Left 08/14/2017   Procedure: CYSTOSCOPY/URETEROSCOPY/HOLMIUM LASER/STENT PLACEMENT;  Surgeon: Hollice Espy, MD;  Location: [facility];  Service: Urology;  Laterality: Left;  Marland Kitchen MAXIMUM ACCESS (MAS)POSTERIOR LUMBAR INTERBODY FUSION (PLIF) 1 LEVEL N/A 08/11/2014   Procedure: FOR MAXIMUM ACCESS (MAS) POSTERIOR LUMBAR INTERBODY FUSION (PLIF) LUMBAR FIVE-SACRAL ONE;  Surgeon: Charlie Pitter, MD;  Location: [facility];  Service: Neurosurgery;  Laterality: N/A;  FOR MAXIMUM ACCESS (MAS) POSTERIOR LUMBAR INTERBODY FUSION (PLIF) LUMBAR FIVE-SACRAL ONE   Family Hx:  Family History  Problem Relation Age of Onset  . Hematuria Mother   . Lung cancer Mother   . Prostate cancer Father   . Kidney cancer Neg Hx   . Bladder Cancer Neg Hx    Social Hx:   Social History   Tobacco Use  . Smoking status: Current Every Day Smoker    Packs/day: 0.50    Years: 30.00    Pack years: 15.00    Types: Cigarettes  . Smokeless tobacco: Never Used  Substance Use Topics  . Alcohol use: No    Comment: occassionally but none  since January  . Drug use: Yes    Types: Marijuana   Medication:    Current Outpatient Medications:  .  cyclobenzaprine (FLEXERIL) 10 MG tablet, Take 10 mg by mouth 2 (two) times daily as needed for muscle spasms. , Disp: , Rfl:  .  DULoxetine (CYMBALTA) 30 MG capsule, One by mouth daily for one week, then two a day, Disp: 53 capsule, Rfl: 0 .  ibuprofen (ADVIL,MOTRIN) 200 MG tablet, Take 800 mg by mouth daily as needed for moderate pain., Disp: , Rfl:  .  loratadine-pseudoephedrine (CLARITIN-D 24-HOUR) 10-240 MG 24 hr tablet, Take 1 tablet by mouth daily., Disp: , Rfl:  .  oxyCODONE-acetaminophen (PERCOCET) 10-325 MG tablet, Take 1 tablet by mouth every 8  (eight) hours as needed for pain. , Disp: , Rfl:  .  atorvastatin (LIPITOR) 10 MG tablet, Take 1 tablet (10 mg total) by mouth at bedtime. (Patient not taking: Reported on 09/27/2018), Disp: 30 tablet, Rfl: 1   Allergies:  Cortisone acetate [cortisone]  Review of Systems: Gen:  Denies  fever, sweats, chills HEENT: Denies blurred vision, double vision. bleeds, sore throat Cvc:  No dizziness, chest pain. Resp:   Denies cough or sputum production, shortness of breath Gi: Denies swallowing difficulty, stomach pain. Gu:  Denies bladder incontinence, burning urine Ext:   No Joint pain, stiffness. Skin: No skin rash,  hives  Endoc:  No polyuria, polydipsia. Psych: No depression, insomnia. Other:  All other systems were reviewed with the patient and were negative other that what is mentioned in the HPI.   Physical Examination:   VS: BP 122/80   Pulse 80   Resp 16   Ht 5' 6"  (1.676 m)   Wt 224 lb (101.6 kg)   SpO2 94%   BMI 36.15 kg/m   General Appearance: No distress  Neuro:without focal findings,  speech normal,  HEENT: PERRLA, EOM intact.   Pulmonary: normal breath sounds, No wheezing.  CardiovascularNormal S1,S2.  No m/r/g.   Abdomen: Benign, Soft, non-tender. Renal:  No costovertebral tenderness  GU:  No performed at this time. Endoc: No evident thyromegaly, no signs of acromegaly. Skin:   warm, no rashes, no ecchymosis  Extremities: normal, no cyanosis, clubbing.  Other findings:    LABORATORY PANEL:   CBC Recent Labs  Lab 09/26/18 0903  WBC 10.0  HGB 17.1  HCT 48.8  PLT 182   ------------------------------------------------------------------------------------------------------------------  Chemistries  Recent Labs  Lab 09/26/18 0903  NA 136  K 4.2  CL 103  CO2 26  GLUCOSE 91  BUN 13  CREATININE 0.84  CALCIUM 9.4  AST 20  ALT 32  BILITOT 0.5    ------------------------------------------------------------------------------------------------------------------  Cardiac Enzymes No results for input(s): TROPONINI in the last 168 hours. ------------------------------------------------------------  RADIOLOGY:  No results found.     Thank  you for the consultation and for allowing [facility] to assist in the care of your patient. Our recommendations are noted above.  Please contact us if we can be of further service.   Marda Stalker, M.D., F.C.C.P.  Board Certified in Internal Medicine, Pulmonary Medicine, [place], and Sleep Medicine.  [facility] Office Number: [phone]   09/27/2018

## 2018-09-27 NOTE — Progress Notes (Signed)
Start statin; recheck lipids in 6-8 weeks

## 2018-10-01 ENCOUNTER — Telehealth: Payer: Self-pay | Admitting: Internal Medicine

## 2018-10-01 NOTE — Telephone Encounter (Signed)
Pt scheduled  

## 2018-10-01 NOTE — Telephone Encounter (Signed)
Patient wife Olin Hauser calling Patient is to have a sleep study through [facility], would like to know if they will be contacting patient or if they should contact facility to set up  Please call to discuss

## 2018-10-10 ENCOUNTER — Telehealth: Payer: Self-pay | Admitting: Internal Medicine

## 2018-10-10 NOTE — Telephone Encounter (Signed)
Patient received call from [facility] that insurance denied study in [place].  Please call to advise.

## 2018-10-11 ENCOUNTER — Other Ambulatory Visit: Payer: Self-pay | Admitting: *Deleted

## 2018-10-11 DIAGNOSIS — G4719 Other hypersomnia: Secondary | ICD-10-CM

## 2018-10-11 NOTE — Telephone Encounter (Signed)
Returned call to patient's wife and explained HST will be ordered.

## 2018-10-11 NOTE — Telephone Encounter (Signed)
ATC pt's wife and no answer, LMOVM for her or patient to return my call to schedule HST. Rhonda J Cobb

## 2018-10-18 ENCOUNTER — Encounter: Payer: 59 | Admitting: Family Medicine

## 2018-10-22 DIAGNOSIS — G4733 Obstructive sleep apnea (adult) (pediatric): Secondary | ICD-10-CM | POA: Diagnosis not present

## 2018-10-25 ENCOUNTER — Telehealth: Payer: Self-pay | Admitting: *Deleted

## 2018-10-25 DIAGNOSIS — G4733 Obstructive sleep apnea (adult) (pediatric): Secondary | ICD-10-CM

## 2018-10-25 NOTE — Telephone Encounter (Signed)
Patient aware Orders placed/cpap titration Nothing further needed

## 2018-10-30 ENCOUNTER — Encounter: Payer: Self-pay | Admitting: Family Medicine

## 2018-10-30 ENCOUNTER — Ambulatory Visit (INDEPENDENT_AMBULATORY_CARE_PROVIDER_SITE_OTHER): Payer: 59 | Admitting: Family Medicine

## 2018-10-30 VITALS — BP 130/82 | HR 81 | Temp 98.1°F | Ht 66.0 in | Wt 221.8 lb

## 2018-10-30 DIAGNOSIS — Z1211 Encounter for screening for malignant neoplasm of colon: Secondary | ICD-10-CM

## 2018-10-30 DIAGNOSIS — M961 Postlaminectomy syndrome, not elsewhere classified: Secondary | ICD-10-CM

## 2018-10-30 DIAGNOSIS — E782 Mixed hyperlipidemia: Secondary | ICD-10-CM

## 2018-10-30 DIAGNOSIS — Z0000 Encounter for general adult medical examination without abnormal findings: Secondary | ICD-10-CM | POA: Diagnosis not present

## 2018-10-30 DIAGNOSIS — Z720 Tobacco use: Secondary | ICD-10-CM

## 2018-10-30 DIAGNOSIS — Z125 Encounter for screening for malignant neoplasm of prostate: Secondary | ICD-10-CM | POA: Diagnosis not present

## 2018-10-30 MED ORDER — SILDENAFIL CITRATE 50 MG PO TABS: 25.0000 mg | ORAL_TABLET | Freq: Every day | ORAL | 11 refills | Status: DC | PRN

## 2018-10-30 MED ORDER — DULOXETINE HCL 30 MG PO CPEP: 90.0000 mg | ORAL_CAPSULE | Freq: Every day | ORAL | 2 refills | Status: DC

## 2018-10-30 NOTE — Progress Notes (Signed)
BP 130/82   Pulse 81   Temp 98.1 F (36.7 C) (Oral)   Ht 5' 6"  (1.676 m)   Wt 221 lb 12.8 oz (100.6 kg)   SpO2 97%   BMI 35.80 kg/m    Subjective:    Patient ID: Nicholas Greene., male    DOB: 1966-01-14, 52 y.o.   MRN: [id number]  HPI: Nicholas Greene. is a 52 y.o. male  Chief Complaint  Patient presents with  . Annual Exam    HPI Here for physical He has a cataract in the right eye, but expensive to get that done Had the home sleep study, going soon for in-house sleep study He has really cut out butter, eating better Taking the cholesterol medicine at night, trying to remember but tough at bedtime Medicine is helping with mood but thinks it could be increased  USPSTF grade A and B recommendations Depression:  Depression screen [facility] 2/9 10/30/2018 09/26/2018  Decreased Interest 0 3  Down, Depressed, Hopeless 0 3  PHQ - 2 Score 0 6  Altered sleeping 3 3  Tired, decreased energy 3 3  Change in appetite 0 0  Feeling bad or failure about yourself  0 3  Trouble concentrating 0 0  Moving slowly or fidgety/restless 0 0  Suicidal thoughts 0 0  PHQ-9 Score 6 15  Difficult doing work/chores Not difficult at all Not difficult at all   Hypertension: BP Readings from Last 3 Encounters:  10/30/18 130/82  09/27/18 122/80  09/26/18 124/76   Obesity: Wt Readings from Last 3 Encounters:  10/30/18 221 lb 12.8 oz (100.6 kg)  09/27/18 224 lb (101.6 kg)  09/26/18 220 lb (99.8 kg)   BMI Readings from Last 3 Encounters:  10/30/18 35.80 kg/m  09/27/18 36.15 kg/m  09/26/18 35.51 kg/m    Immunizations: the next two days after the flu shot, he got snotty, he had crud coming out; not achy Skin cancer: nevus on the left arm, been there foreever Lung cancer:  Smoker, smoking 6-10 cigs a day; been quitting for ten years; quit 6-8 months Wife asked about getting the little blue pills; since back surgery; no nerve injury Prostate cancer: father had prostate cancer; he  was in his late 5s; no one in the family He has baldness and took accutane for four years; bald by age 89; two cysts in the back No results found for: PSA Colorectal cancer: cologuard at age 55 AAA: n/a Aspirin: n/a Diet: cut out butter, eating more oatmeal and really watching diet Exercise: redoing kitchen, remodeling; tries to do some walking; does hunt Alcohol:    Office Visit from 10/30/2018 in [facility]  AUDIT-C Score  1      Tobacco use: doscussed HIV, hep B, hep C: no desire STD testing and prevention (chl/gon/syphilis): no desire Glucose:  Glucose  Date Value Ref Range Status  10/03/2017 89 65 - 99 mg/dL Final  03/15/2013 208 (H) 65 - 99 mg/dL Final  03/14/2013 173 (H) 65 - 99 mg/dL Final  10/30/2012 158 (H) 65 - 99 mg/dL Final   Glucose, Bld  Date Value Ref Range Status  09/26/2018 91 65 - 139 mg/dL Final    Comment:    .        Non-fasting reference interval .   08/09/2017 122 (H) 65 - 99 mg/dL Final  08/03/2017 137 (H) 65 - 99 mg/dL Final   Lipids:  Lab Results  Component Value Date  CHOL 248 (H) 09/26/2018   Lab Results  Component Value Date   HDL 49 09/26/2018   Lab Results  Component Value Date   LDLCALC 161 (H) 09/26/2018   Lab Results  Component Value Date   TRIG 211 (H) 09/26/2018   Lab Results  Component Value Date   CHOLHDL 5.1 (H) 09/26/2018   No results found for: LDLDIRECT   Depression screen [facility] 2/9 10/30/2018 09/26/2018  Decreased Interest 0 3  Down, Depressed, Hopeless 0 3  PHQ - 2 Score 0 6  Altered sleeping 3 3  Tired, decreased energy 3 3  Change in appetite 0 0  Feeling bad or failure about yourself  0 3  Trouble concentrating 0 0  Moving slowly or fidgety/restless 0 0  Suicidal thoughts 0 0  PHQ-9 Score 6 15  Difficult doing work/chores Not difficult at all Not difficult at all   Fall Risk  10/30/2018 09/26/2018  Falls in the past year? 1 Yes  Number falls in past yr: 1 2 or more  Injury  with Fall? 0 No    Relevant past medical, surgical, family and social history reviewed Past Medical History:  Diagnosis Date  . Anxiety   . Chronic kidney disease    kidney stones since age 42  . GERD (gastroesophageal reflux disease)    with tomatoe products  . Hypercholesteremia   . Hypertension   . Sleep apnea    cpap bring machine tested 10-12 yrs ago   Past Surgical History:  Procedure Laterality Date  . BACK SURGERY     Jan 17 2014  . CYSTOSCOPY  2007  . CYSTOSCOPY/URETEROSCOPY/HOLMIUM LASER/STENT PLACEMENT Left 08/14/2017   Procedure: CYSTOSCOPY/URETEROSCOPY/HOLMIUM LASER/STENT PLACEMENT;  Surgeon: Hollice Espy, MD;  Location: [facility] ORS;  Service: Urology;  Laterality: Left;  Marland Kitchen MAXIMUM ACCESS (MAS)POSTERIOR LUMBAR INTERBODY FUSION (PLIF) 1 LEVEL N/A 08/11/2014   Procedure: FOR MAXIMUM ACCESS (MAS) POSTERIOR LUMBAR INTERBODY FUSION (PLIF) LUMBAR FIVE-SACRAL ONE;  Surgeon: Charlie Pitter, MD;  Location: [facility];  Service: Neurosurgery;  Laterality: N/A;  FOR MAXIMUM ACCESS (MAS) POSTERIOR LUMBAR INTERBODY FUSION (PLIF) LUMBAR FIVE-SACRAL ONE   Family History  Problem Relation Age of Onset  . Hematuria Mother   . Lung cancer Mother   . Prostate cancer Father   . Kidney cancer Neg Hx   . Bladder Cancer Neg Hx    Social History   Tobacco Use  . Smoking status: Current Every Day Smoker    Packs/day: 0.50    Years: 30.00    Pack years: 15.00    Types: Cigarettes  . Smokeless tobacco: Never Used  Substance Use Topics  . Alcohol use: No    Comment: occassionally but none since January  . Drug use: Yes    Types: Marijuana     [facility] Visit from 10/30/2018 in [facility]  AUDIT-C Score  1      Interim medical history since last visit reviewed. Allergies and medications reviewed  Review of Systems Per HPI unless specifically indicated above     Objective:    BP 130/82   Pulse 81   Temp 98.1 F (36.7 C) (Oral)   Ht 5' 6"  (1.676 m)    Wt 221 lb 12.8 oz (100.6 kg)   SpO2 97%   BMI 35.80 kg/m   Wt Readings from Last 3 Encounters:  10/30/18 221 lb 12.8 oz (100.6 kg)  09/27/18 224 lb (101.6 kg)  09/26/18 220 lb (99.8 kg)  Physical Exam  Constitutional: He appears well-developed and well-nourished. No distress.  HENT:  Head: Normocephalic and atraumatic.  Nose: Nose normal.  Mouth/Throat: Oropharynx is clear and moist.  Eyes: EOM are normal. No scleral icterus.  Neck: No JVD present. No thyromegaly present.  Cardiovascular: Normal rate, regular rhythm and normal heart sounds.  Pulmonary/Chest: Effort normal and breath sounds normal. No respiratory distress. He has no wheezes. He has no rales.  Abdominal: Soft. Bowel sounds are normal. He exhibits no distension. There is no tenderness. There is no guarding.  Musculoskeletal: Normal range of motion. He exhibits no edema.  Lymphadenopathy:    He has no cervical adenopathy.  Neurological: He is alert. He displays normal reflexes. He exhibits normal muscle tone. Coordination normal.  Skin: Skin is warm and dry. No rash noted. He is not diaphoretic. No erythema. No pallor.  Psychiatric: He has a normal mood and affect. His behavior is normal. Judgment and thought content normal.    Results for orders placed or performed in visit on 09/26/18  CBC with Differential/Platelet  Result Value Ref Range   WBC 10.0 3.8 - 10.8 Thousand/uL   RBC 5.77 4.20 - 5.80 Million/uL   Hemoglobin 17.1 13.2 - 17.1 g/dL   HCT 48.8 38.5 - 50.0 %   MCV 84.6 80.0 - 100.0 fL   MCH 29.6 27.0 - 33.0 pg   MCHC 35.0 32.0 - 36.0 g/dL   RDW 13.8 11.0 - 15.0 %   Platelets 182 140 - 400 Thousand/uL   MPV 11.3 7.5 - 12.5 fL   Neutro Abs 6,100 1,500 - 7,800 cells/uL   Lymphs Abs 3,030 850 - 3,900 cells/uL   WBC mixed population 650 200 - 950 cells/uL   Eosinophils Absolute 150 15 - 500 cells/uL   Basophils Absolute 70 0 - 200 cells/uL   Neutrophils Relative % 61 %   Total Lymphocyte 30.3 %    Monocytes Relative 6.5 %   Eosinophils Relative 1.5 %   Basophils Relative 0.7 %  COMPLETE METABOLIC PANEL WITH GFR  Result Value Ref Range   Glucose, Bld 91 65 - 139 mg/dL   BUN 13 7 - 25 mg/dL   Creat 0.84 0.60 - 1.35 mg/dL   GFR, Est Non African American 103 > OR = 60 mL/min/1.61m   GFR, Est African American 119 > OR = 60 mL/min/1.730m  BUN/Creatinine Ratio NOT APPLICABLE 6 - 22 (calc)   Sodium 136 135 - 146 mmol/L   Potassium 4.2 3.5 - 5.3 mmol/L   Chloride 103 98 - 110 mmol/L   CO2 26 20 - 32 mmol/L   Calcium 9.4 8.6 - 10.3 mg/dL   Total Protein 6.8 6.1 - 8.1 g/dL   Albumin 4.6 3.6 - 5.1 g/dL   Globulin 2.2 1.9 - 3.7 g/dL (calc)   AG Ratio 2.1 1.0 - 2.5 (calc)   Total Bilirubin 0.5 0.2 - 1.2 mg/dL   Alkaline phosphatase (APISO) 66 40 - 115 U/L   AST 20 10 - 40 U/L   ALT 32 9 - 46 U/L  Lipid panel  Result Value Ref Range   Cholesterol 248 (H) <200 mg/dL   HDL 49 >40 mg/dL   Triglycerides 211 (H) <150 mg/dL   LDL Cholesterol (Calc) 161 (H) mg/dL (calc)   Total CHOL/HDL Ratio 5.1 (H) <5.0 (calc)   Non-HDL Cholesterol (Calc) 199 (H) <130 mg/dL (calc)  TSH  Result Value Ref Range   TSH 1.47 0.40 - 4.50 mIU/L  Assessment & Plan:   Problem List Items Addressed This Visit      Other   Tobacco abuse    I am here to help if needed; planted seed; he has managed to quit for 6-8 months in the past, encouragement given      Preventative health care - Primary    USPSTF grade A and B recommendations reviewed with patient; age-appropriate recommendations, preventive care, screening tests, etc discussed and encouraged; healthy living encouraged; see AVS for patient education given to patient       Morbid obesity ([place])    BMI >35 with HTN, OSA, high cholesterol; encouragement given for ongoing weight loss      Hyperlipidemia    He may take the lipitor in the evening if easier to remember then; check lipids in a few weeks      Relevant Medications   sildenafil  (VIAGRA) 50 MG tablet   Failed back surgical syndrome    Increase the cymbalta from 60 mg to 90 mg daily; call with update in a few weeks       Other Visit Diagnoses    Screening for prostate cancer       Relevant Orders   PSA   Colon cancer screening       Relevant Orders   Cologuard       Follow up plan: Return in about 1 year (around 10/31/2019) for complete physical.  An after-visit summary was printed and given to the patient at [place].  Please see the patient instructions which may contain other information and recommendations beyond what is mentioned above in the assessment and plan.  Meds ordered this encounter  Medications  . sildenafil (VIAGRA) 50 MG tablet    Sig: Take 0.5-1 tablets (25-50 mg total) by mouth daily as needed for erectile dysfunction.    Dispense:  10 tablet    Refill:  11  . DISCONTD: DULoxetine (CYMBALTA) 30 MG capsule    Sig: Take 3 capsules (90 mg total) by mouth daily. One by mouth daily for one week, then two a day    Dispense:  90 capsule    Refill:  2  . DULoxetine (CYMBALTA) 30 MG capsule    Sig: Take 3 capsules (90 mg total) by mouth daily.    Dispense:  90 capsule    Refill:  2    *USE THIS ONE INSTEAD*    Orders Placed This Encounter  Procedures  . PSA  . Cologuard

## 2018-10-30 NOTE — Assessment & Plan Note (Signed)
USPSTF grade A and B recommendations reviewed with patient; age-appropriate recommendations, preventive care, screening tests, etc discussed and encouraged; healthy living encouraged; see AVS for patient education given to patient

## 2018-10-30 NOTE — Assessment & Plan Note (Signed)
I am here to help if needed; planted seed; he has managed to quit for 6-8 months in the past, encouragement given

## 2018-10-30 NOTE — Assessment & Plan Note (Signed)
BMI >35 with HTN, OSA, high cholesterol; encouragement given for ongoing weight loss

## 2018-10-30 NOTE — Assessment & Plan Note (Signed)
He may take the lipitor in the evening if easier to remember then; check lipids in a few weeks

## 2018-10-30 NOTE — Assessment & Plan Note (Signed)
Increase the cymbalta from 60 mg to 90 mg daily; call with update in a few weeks

## 2018-10-30 NOTE — Patient Instructions (Addendum)
Let's get labs around the last week of November (Cholesterol and prostate)  I do encourage you to quit smoking Call [phone] to sign up for smoking cessation classes You can call 1-800-QUIT-NOW to talk with a smoking cessation coach  Call me or write through [place] how the increased dose of duloxetine is doing in 3-4 weeks  Health Maintenance, Male A healthy lifestyle and preventive care is important for your health and wellness. Ask your health care provider about what schedule of regular examinations is right for you. What should I know about weight and diet? Eat a Healthy Diet  Eat plenty of vegetables, fruits, whole grains, low-fat dairy products, and lean protein.  Do not eat a lot of foods high in solid fats, added sugars, or salt.  Maintain a Healthy Weight Regular exercise can help you achieve or maintain a healthy weight. You should:  Do at least 150 minutes of exercise each week. The exercise should increase your heart rate and make you sweat (moderate-intensity exercise).  Do strength-training exercises at least twice a week.  Watch Your Levels of Cholesterol and Blood Lipids  Have your blood tested for lipids and cholesterol every 5 years starting at 50 years of age. If you are at high risk for heart disease, you should start having your blood tested when you are 50 years old. You may need to have your cholesterol levels checked more often if: ? Your lipid or cholesterol levels are high. ? You are older than 50 years of age. ? You are at high risk for heart disease.  What should I know about cancer screening? Many types of cancers can be detected early and may often be prevented. Lung Cancer  You should be screened every year for lung cancer if: ? You are a current smoker who has smoked for at least 30 years. ? You are a former smoker who has quit within the past 15 years.  Talk to your health care provider about your screening options, when you should start  screening, and how often you should be screened.  Colorectal Cancer  Routine colorectal cancer screening usually begins at 50 years of age and should be repeated every 5-10 years until you are 50 years old. You may need to be screened more often if early forms of precancerous polyps or small growths are found. Your health care provider may recommend screening at an earlier age if you have risk factors for colon cancer.  Your health care provider may recommend using home test kits to check for hidden blood in the stool.  A small camera at the end of a tube can be used to examine your colon (sigmoidoscopy or colonoscopy). This checks for the earliest forms of colorectal cancer.  Prostate and Testicular Cancer  Depending on your age and overall health, your health care provider may do certain tests to screen for prostate and testicular cancer.  Talk to your health care provider about any symptoms or concerns you have about testicular or prostate cancer.  Skin Cancer  Check your skin from head to toe regularly.  Tell your health care provider about any new moles or changes in moles, especially if: ? There is a change in a mole's size, shape, or color. ? You have a mole that is larger than a pencil eraser.  Always use sunscreen. Apply sunscreen liberally and repeat throughout the day.  Protect yourself by wearing long sleeves, pants, a wide-brimmed hat, and sunglasses when outside.  What should I know  about heart disease, diabetes, and high blood pressure?  If you are 75-39 years of age, have your blood pressure checked every 3-5 years. If you are 38 years of age or older, have your blood pressure checked every year. You should have your blood pressure measured twice-once when you are at a hospital or clinic, and once when you are not at a hospital or clinic. Record the average of the two measurements. To check your blood pressure when you are not at a hospital or clinic, you can use: ? An  automated blood pressure machine at a pharmacy. ? A home blood pressure monitor.  Talk to your health care provider about your target blood pressure.  If you are between 49-52 years old, ask your health care provider if you should take aspirin to prevent heart disease.  Have regular diabetes screenings by checking your fasting blood sugar level. ? If you are at a normal weight and have a low risk for diabetes, have this test once every three years after the age of 42. ? If you are overweight and have a high risk for diabetes, consider being tested at a younger age or more often.  A one-time screening for abdominal aortic aneurysm (AAA) by ultrasound is recommended for men aged 32-75 years who are current or former smokers. What should I know about preventing infection? Hepatitis B If you have a higher risk for hepatitis B, you should be screened for this virus. Talk with your health care provider to find out if you are at risk for hepatitis B infection. Hepatitis C Blood testing is recommended for:  Everyone born from [birth year] through 1963.  Anyone with known risk factors for hepatitis C.  Sexually Transmitted Diseases (STDs)  You should be screened each year for STDs including gonorrhea and chlamydia if: ? You are sexually active and are younger than 50 years of age. ? You are older than 50 years of age and your health care provider tells you that you are at risk for this type of infection. ? Your sexual activity has changed since you were last screened and you are at an increased risk for chlamydia or gonorrhea. Ask your health care provider if you are at risk.  Talk with your health care provider about whether you are at high risk of being infected with HIV. Your health care provider may recommend a prescription medicine to help prevent HIV infection.  What else can I do?  Schedule regular health, dental, and eye exams.  Stay current with your vaccines (immunizations).  Do not use  any tobacco products, such as cigarettes, chewing tobacco, and e-cigarettes. If you need help quitting, ask your health care provider.  Limit alcohol intake to no more than 2 drinks per day. One drink equals 12 ounces of beer, 5 ounces of wine, or 1 ounces of hard liquor.  Do not use street drugs.  Do not share needles.  Ask your health care provider for help if you need support or information about quitting drugs.  Tell your health care provider if you often feel depressed.  Tell your health care provider if you have ever been abused or do not feel safe at home. This information is not intended to replace advice given to you by your health care provider. Make sure you discuss any questions you have with your health care provider. Document Released: 06/02/2008 Document Revised: 08/03/2016 Document Reviewed: 09/08/2015 Elsevier Interactive Patient Education  [employer].

## 2018-11-05 DIAGNOSIS — Z1211 Encounter for screening for malignant neoplasm of colon: Secondary | ICD-10-CM | POA: Diagnosis not present

## 2018-11-07 ENCOUNTER — Ambulatory Visit: Payer: 59 | Attending: Internal Medicine

## 2018-11-07 DIAGNOSIS — G4733 Obstructive sleep apnea (adult) (pediatric): Secondary | ICD-10-CM | POA: Insufficient documentation

## 2018-11-10 LAB — COLOGUARD: Cologuard: NEGATIVE

## 2018-11-13 DIAGNOSIS — G4733 Obstructive sleep apnea (adult) (pediatric): Secondary | ICD-10-CM

## 2018-11-19 ENCOUNTER — Telehealth: Payer: Self-pay | Admitting: Internal Medicine

## 2018-11-19 DIAGNOSIS — G4733 Obstructive sleep apnea (adult) (pediatric): Secondary | ICD-10-CM

## 2018-11-19 NOTE — Telephone Encounter (Signed)
Pt is aware of CPAP titration study results.   Date of test: 11/07/18 Results: set up auto CPAP 10-20cm H20 Last OV: 09/27/18  Order has been placed.   Pt will contact our office to set up return OV once he has received CPAP machine.

## 2018-12-03 DIAGNOSIS — G4733 Obstructive sleep apnea (adult) (pediatric): Secondary | ICD-10-CM | POA: Diagnosis not present

## 2018-12-04 DIAGNOSIS — G4733 Obstructive sleep apnea (adult) (pediatric): Secondary | ICD-10-CM | POA: Diagnosis not present

## 2018-12-31 ENCOUNTER — Other Ambulatory Visit: Payer: Self-pay | Admitting: *Deleted

## 2018-12-31 DIAGNOSIS — G4719 Other hypersomnia: Secondary | ICD-10-CM

## 2019-01-03 DIAGNOSIS — G4733 Obstructive sleep apnea (adult) (pediatric): Secondary | ICD-10-CM | POA: Diagnosis not present

## 2019-01-03 DIAGNOSIS — Z6836 Body mass index (BMI) 36.0-36.9, adult: Secondary | ICD-10-CM | POA: Diagnosis not present

## 2019-01-03 DIAGNOSIS — M4727 Other spondylosis with radiculopathy, lumbosacral region: Secondary | ICD-10-CM | POA: Diagnosis not present

## 2019-01-22 ENCOUNTER — Telehealth: Payer: Self-pay

## 2019-01-22 NOTE — Telephone Encounter (Signed)
Disability paperwork received via mail from [employer]. Faxed to [employer].

## 2019-02-03 DIAGNOSIS — G4733 Obstructive sleep apnea (adult) (pediatric): Secondary | ICD-10-CM | POA: Diagnosis not present

## 2019-02-27 DIAGNOSIS — J0110 Acute frontal sinusitis, unspecified: Secondary | ICD-10-CM | POA: Diagnosis not present

## 2019-03-04 DIAGNOSIS — G4733 Obstructive sleep apnea (adult) (pediatric): Secondary | ICD-10-CM | POA: Diagnosis not present

## 2019-05-20 ENCOUNTER — Telehealth: Payer: Self-pay | Admitting: Internal Medicine

## 2019-05-20 NOTE — Telephone Encounter (Signed)
Spoke to [place] at [place], [employer] has switched so they need a new rx for CPAP and supplies, most recent office notes and medication list. Fax to [phone].  Called and spoke to patient, he was never seen for compliance visit. Set up for 05/23/19 virtual visit with Dr. Juanell Fairly, will fax appropriate paperwork after visit.

## 2019-05-22 NOTE — Progress Notes (Signed)
[name]     Virtual Visit via Telephone Note I connected with patient on 05/23/19 at  8:30 AM EDT by telephone and verified that I am speaking with the correct person using two identifiers.   I discussed the limitations, risks, security and privacy concerns of performing an evaluation and management service by telephone and the availability of in person appointments. I also discussed with the patient that there may be a patient responsible charge related to this service. The patient expressed understanding and agreed to proceed. I discussed the assessment and treatment plan with the patient. The patient was provided an opportunity to ask questions and all were answered. The patient agreed with the plan and demonstrated an understanding of the instructions. Please see note below for further detail.    The patient was advised to call back or seek an in-person evaluation if the symptoms worsen or if the condition fails to improve as anticipated.  [name], MD   Assessment and Plan:  Obstructive sleep apnea. - Symptoms and signs of obstructive sleep apnea, with history of severe sleep apnea in the past treated with BiPAP at high pressure 28/22. Now doing well on CPAP with pressure range of 10-20 cm H2O.    Chronic back pain. - On chronic narcotic medication.  Patient may therefore not qualify for home sleep test, may require a in lab sleep study.  Orders Placed This Encounter  Procedures   AMB REFERRAL FOR DME   Return in about 1 year (around 05/22/2020).   Date: 05/22/2019  MRN# [id number] Nicholas Greene. 10-06-1966   Nicholas Greene. is a 53 y.o. old male seen in consultation for chief complaint of: sleep apnea.      HPI:   The patient is a 53 year old male, with a previous diagnosis of obstructive sleep apnea managed with Bipap at 28/22. He lost his insurance about 4-5 years ago after he had an injury. About 2-3 years  ago his machine broke, and he has been without bipap since that time. He is very tired during the day.  He has lost 60 pounds since he for started on BiPAP due to multiple surgeries.  At last visit he was sent for a new sleep study which showed very severe obstructive sleep apnea with AHI of 71, he was started on CPAP pressure range 10-20.  Review of patient's download shows that his compliance is excellent and his OSA is well controlled with current settings. He has been using his cpap every night, he is no longer snoring and feels more awake during the day.  Since his last visit he has been doing well. He is cleaning mask every day.    Review of old records: the patient has sleep apnea, was previously placed on BiPAP at a pressure of 28/22 per order on 11/07/07. Patient returns today with symptoms of excessive daytime sleepiness.  Epworth score is 14.  **CPAP download data 04/22/2019-05/21/2019>> usage greater than 4 hours is 30/30 days.  Average usage on days used is 6 hours 57 minutes.  Pressure range 10-20, median pressure 11, 95th percentile pressure 13.6, maximum pressure 15.4.  Leaks are within normal limits.  Residual AHI is 1.3.  Overall this shows excellent compliance with CPAP with excellent control of obstructive sleep apnea. **CPAP titration  11/07/18>> Recommend Auto-CPAP 10-20 cm H2O.  **HST 10/22/18>> Severe AHI 71.   Medication:    Current Outpatient Medications:    atorvastatin (LIPITOR) 10 MG tablet, Take  1 tablet (10 mg total) by mouth at bedtime., Disp: 30 tablet, Rfl: 1   cyclobenzaprine (FLEXERIL) 10 MG tablet, Take 10 mg by mouth 2 (two) times daily as needed for muscle spasms. , Disp: , Rfl:    DULoxetine (CYMBALTA) 30 MG capsule, Take 3 capsules (90 mg total) by mouth daily., Disp: 90 capsule, Rfl: 2   ibuprofen (ADVIL,MOTRIN) 200 MG tablet, Take 800 mg by mouth daily as needed for moderate pain., Disp: , Rfl:    loratadine-pseudoephedrine (CLARITIN-D 24-HOUR) 10-240 MG  24 hr tablet, Take 1 tablet by mouth daily., Disp: , Rfl:    oxyCODONE-acetaminophen (PERCOCET) 10-325 MG tablet, Take 1 tablet by mouth every 8 (eight) hours as needed for pain. , Disp: , Rfl:    sildenafil (VIAGRA) 50 MG tablet, Take 0.5-1 tablets (25-50 mg total) by mouth daily as needed for erectile dysfunction., Disp: 10 tablet, Rfl: 11   Allergies:  Cortisone acetate [cortisone]  Review of Systems:  Constitutional: Feels well. Cardiovascular: Denies chest pain, exertional chest pain.  Pulmonary: Denies hemoptysis, pleuritic chest pain.   The remainder of systems were reviewed and were found to be negative other than what is documented in the HPI.      Physical Examination:  --    LABORATORY PANEL:   CBC No results for input(s): WBC, HGB, HCT, PLT in the last 168 hours. ------------------------------------------------------------------------------------------------------------------  Chemistries  No results for input(s): NA, K, CL, CO2, GLUCOSE, BUN, CREATININE, CALCIUM, MG, AST, ALT, ALKPHOS, BILITOT in the last 168 hours.  Invalid input(s): GFRCGP ------------------------------------------------------------------------------------------------------------------  Cardiac Enzymes No results for input(s): TROPONINI in the last 168 hours. ------------------------------------------------------------  RADIOLOGY:  No results found.     Thank  you for the consultation and for allowing [facility] to assist in the care of your patient. Our recommendations are noted above.  Please contact us if we can be of further service.   Marda Stalker, M.D., F.C.C.P.  Board Certified in Internal Medicine, Pulmonary Medicine, [place], and Sleep Medicine.  [facility] Office Number: [phone]   05/22/2019

## 2019-05-23 ENCOUNTER — Ambulatory Visit (INDEPENDENT_AMBULATORY_CARE_PROVIDER_SITE_OTHER): Payer: No Typology Code available for payment source | Admitting: Internal Medicine

## 2019-05-23 DIAGNOSIS — G4733 Obstructive sleep apnea (adult) (pediatric): Secondary | ICD-10-CM

## 2019-05-23 NOTE — Telephone Encounter (Signed)
Patient had phone visit today. Orders placed for CPAP, community message has been sent by Rodena Piety to [facility].

## 2019-05-23 NOTE — Patient Instructions (Signed)
Continue to use CPAP every night.

## 2019-06-06 ENCOUNTER — Other Ambulatory Visit: Payer: Self-pay

## 2019-06-06 ENCOUNTER — Emergency Department
Admission: EM | Admit: 2019-06-06 | Discharge: 2019-06-06 | Disposition: A | Discharge status: Home / Self Care | Payer: No Typology Code available for payment source | Attending: Emergency Medicine | Admitting: Emergency Medicine

## 2019-06-06 ENCOUNTER — Ambulatory Visit: Payer: Self-pay

## 2019-06-06 DIAGNOSIS — Y929 Unspecified place or not applicable: Secondary | ICD-10-CM | POA: Insufficient documentation

## 2019-06-06 DIAGNOSIS — F17210 Nicotine dependence, cigarettes, uncomplicated: Secondary | ICD-10-CM | POA: Insufficient documentation

## 2019-06-06 DIAGNOSIS — S81812A Laceration without foreign body, left lower leg, initial encounter: Secondary | ICD-10-CM | POA: Insufficient documentation

## 2019-06-06 DIAGNOSIS — Z23 Encounter for immunization: Secondary | ICD-10-CM | POA: Insufficient documentation

## 2019-06-06 DIAGNOSIS — I10 Essential (primary) hypertension: Secondary | ICD-10-CM | POA: Insufficient documentation

## 2019-06-06 DIAGNOSIS — Z79899 Other long term (current) drug therapy: Secondary | ICD-10-CM | POA: Diagnosis not present

## 2019-06-06 DIAGNOSIS — Y93H2 Activity, gardening and landscaping: Secondary | ICD-10-CM | POA: Diagnosis not present

## 2019-06-06 DIAGNOSIS — W312XXA Contact with powered woodworking and forming machines, initial encounter: Secondary | ICD-10-CM | POA: Insufficient documentation

## 2019-06-06 DIAGNOSIS — Y999 Unspecified external cause status: Secondary | ICD-10-CM | POA: Insufficient documentation

## 2019-06-06 MED ORDER — TRAMADOL HCL 50 MG PO TABS: 50.0000 mg | ORAL_TABLET | Freq: Four times a day (QID) | ORAL | 0 refills | Status: AC | PRN

## 2019-06-06 MED ORDER — TRAMADOL HCL 50 MG PO TABS: 50.0000 mg | ORAL_TABLET | Freq: Four times a day (QID) | ORAL | 0 refills | Status: DC | PRN

## 2019-06-06 MED ORDER — TETANUS-DIPHTH-ACELL PERTUSSIS 5-2.5-18.5 LF-MCG/0.5 IM SUSP
0.5000 mL | Freq: Once | INTRAMUSCULAR | Status: AC
  Administered 2019-06-06: 16:00:00 0.5 mL via INTRAMUSCULAR
  Filled 2019-06-06: qty 0.5

## 2019-06-06 MED ORDER — SULFAMETHOXAZOLE-TRIMETHOPRIM 800-160 MG PO TABS: 1.0000 | ORAL_TABLET | Freq: Two times a day (BID) | ORAL | 0 refills | Status: DC

## 2019-06-06 MED ORDER — LIDOCAINE HCL (PF) 1 % IJ SOLN
10.0000 mL | Freq: Once | INTRAMUSCULAR | Status: AC
  Administered 2019-06-06: 10 mL

## 2019-06-06 MED ORDER — LIDOCAINE HCL (PF) 1 % IJ SOLN
INTRAMUSCULAR | Status: AC
  Administered 2019-06-06: 10 mL
  Filled 2019-06-06: qty 10

## 2019-06-06 NOTE — Telephone Encounter (Signed)
  Pt wife called stating that her husband had just cut his leg open with a chain saw. I is the chin area. No triage was completed. Wife was instructed that patient needs to go to the ER for evaluation of injury. Pt wife verbalized understanding and patient will be taken to nearest ER. Reason for Disposition . Skin is split open or gaping (or length > 1/2 inch or 12 mm)  Answer Assessment - Initial Assessment Questions 1. MECHANISM: "How did the injury happen?" (e.g., twisting injury, direct blow)      Today with chain saw 2. ONSET: "When did the injury happen?" (Minutes or hours ago)      *No Answer* 3. LOCATION: "Where is the injury located?"      *No Answer* 4. APPEARANCE of INJURY: "What does the injury look like?"  (e.g., deformity of leg)     *No Answer* 5. SEVERITY: "Can you put weight on that leg?" "Can you walk?"      *No Answer* 6. SIZE: For cuts, bruises, or swelling, ask: "How large is it?" (e.g., inches or centimeters)      *No Answer* 7. PAIN: "Is there pain?" If so, ask: "How bad is the pain?"  (Scale 1-10; or mild, moderate, severe)     *No Answer* 8. TETANUS: For any breaks in the skin, ask: "When was the last tetanus booster?"     *No Answer* 9. OTHER SYMPTOMS: "Do you have any other symptoms?"      *No Answer* 10. PREGNANCY: "Is there any chance you are pregnant?" "When was your last menstrual period?"       *No Answer*  Protocols used: LEG INJURY-A-AH

## 2019-06-06 NOTE — ED Provider Notes (Signed)
[facility] Emergency Department Provider Note   ____________________________________________   First MD Initiated Contact with Patient 06/06/19 1529     (approximate)  I have reviewed the triage vital signs and the nursing notes.   HISTORY  Chief Complaint Laceration    HPI Orel Cooler. is a 53 y.o. male patient presents for laceration to the left lower leg.  Injury is secondary to a chain saw.  Patient is a bleeding is controlled with direct pressure.  Patient tetanus shot is not up-to-date.  Patient rates pain as a 8/10.  Patient described the pain is "achy".  Patient denies loss sensation loss of function.  No other palliative measure for complaint.         Past Medical History:  Diagnosis Date  . Anxiety   . Chronic kidney disease    kidney stones since age 3  . GERD (gastroesophageal reflux disease)    with tomatoe products  . Hypercholesteremia   . Hypertension   . Sleep apnea    cpap bring machine tested 10-12 yrs ago    Patient Active Problem List   Diagnosis Date Noted  . Preventative health care 10/30/2018  . Failed back surgical syndrome 09/26/2018  . Morbid obesity ([place]) 09/26/2018  . Essential hypertension, benign 09/26/2018  . Hyperlipidemia 09/26/2018  . Tobacco abuse 09/26/2018  . Lumbosacral spondylosis without myelopathy 08/11/2014  . Lumbar spondylosis 08/11/2014  . Sleep apnea treated with nocturnal BiPAP 11/07/2007    Past Surgical History:  Procedure Laterality Date  . BACK SURGERY     Jan 17 2014  . CYSTOSCOPY  2007  . CYSTOSCOPY/URETEROSCOPY/HOLMIUM LASER/STENT PLACEMENT Left 08/14/2017   Procedure: CYSTOSCOPY/URETEROSCOPY/HOLMIUM LASER/STENT PLACEMENT;  Surgeon: Hollice Espy, MD;  Location: [facility] ORS;  Service: Urology;  Laterality: Left;  Marland Kitchen MAXIMUM ACCESS (MAS)POSTERIOR LUMBAR INTERBODY FUSION (PLIF) 1 LEVEL N/A 08/11/2014   Procedure: FOR MAXIMUM ACCESS (MAS) POSTERIOR LUMBAR INTERBODY FUSION  (PLIF) LUMBAR FIVE-SACRAL ONE;  Surgeon: Charlie Pitter, MD;  Location: [facility];  Service: Neurosurgery;  Laterality: N/A;  FOR MAXIMUM ACCESS (MAS) POSTERIOR LUMBAR INTERBODY FUSION (PLIF) LUMBAR FIVE-SACRAL ONE    Prior to Admission medications   Medication Sig Start Date End Date Taking? Authorizing Provider  atorvastatin (LIPITOR) 10 MG tablet Take 1 tablet (10 mg total) by mouth at bedtime. 09/27/18   Arnetha Courser, MD  cyclobenzaprine (FLEXERIL) 10 MG tablet Take 10 mg by mouth 2 (two) times daily as needed for muscle spasms.     [provider]  DULoxetine (CYMBALTA) 30 MG capsule Take 3 capsules (90 mg total) by mouth daily. 10/30/18   Arnetha Courser, MD  ibuprofen (ADVIL,MOTRIN) 200 MG tablet Take 800 mg by mouth daily as needed for moderate pain.    [provider]  loratadine-pseudoephedrine (CLARITIN-D 24-HOUR) 10-240 MG 24 hr tablet Take 1 tablet by mouth daily.    [provider]  oxyCODONE-acetaminophen (PERCOCET) 10-325 MG tablet Take 1 tablet by mouth every 8 (eight) hours as needed for pain.     [provider]  sildenafil (VIAGRA) 50 MG tablet Take 0.5-1 tablets (25-50 mg total) by mouth daily as needed for erectile dysfunction. 10/30/18   Arnetha Courser, MD  sulfamethoxazole-trimethoprim (BACTRIM DS) 800-160 MG tablet Take 1 tablet by mouth 2 (two) times daily. 06/06/19   Sable Feil, PA-C  traMADol (ULTRAM) 50 MG tablet Take 1 tablet (50 mg total) by mouth every 6 (six) hours as needed. 06/06/19 06/05/20  Sable Feil, PA-C    Allergies Cortisone acetate [cortisone]  Family History  Problem Relation Age of Onset  . Hematuria Mother   . Lung cancer Mother   . Prostate cancer Father   . Kidney cancer Neg Hx   . Bladder Cancer Neg Hx     Social History Social History   Tobacco Use  . Smoking status: Current Every Day Smoker    Packs/day: 0.50    Years: 30.00    Pack years: 15.00    Types: Cigarettes  . Smokeless  tobacco: Never Used  Substance Use Topics  . Alcohol use: No    Comment: occassionally but none since January  . Drug use: Yes    Types: Marijuana    Review of Systems Constitutional: No fever/chills Eyes: No visual changes. ENT: No sore throat. Cardiovascular: Denies chest pain. Respiratory: Denies shortness of breath. Gastrointestinal: No abdominal pain.  No nausea, no vomiting.  No diarrhea.  No constipation. Genitourinary: Negative for dysuria. Musculoskeletal: Negative for back pain. Skin: Negative for rash. Neurological: Negative for headaches, focal weakness or numbness. Psychiatric:  Anxiety. Endocrine:  Hyperlipidemia hypertension. Allergic/Immunilogical: Steroids.  ____________________________________________   PHYSICAL EXAM:  VITAL SIGNS: ED Triage Vitals [06/06/19 1454]  Enc Vitals Group     BP 125/77     Pulse Rate 91     Resp 19     Temp 98.8 F (37.1 C)     Temp Source Oral     SpO2 95 %     Weight 220 lb (99.8 kg)     Height 5' 6"  (1.676 m)     Head Circumference      Peak Flow      Pain Score      Pain Loc      Pain Edu?      Excl. in [place]?    Constitutional: Alert and oriented. Well appearing and in no acute distress. Cardiovascular: Normal rate, regular rhythm. Grossly normal heart sounds.  Good peripheral circulation. Respiratory: Normal respiratory effort.  No retractions. Lungs CTAB. Gastrointestinal: Soft and nontender. No distention. No abdominal bruits. No CVA tenderness. Musculoskeletal: No lower extremity tenderness nor edema.  No joint effusions. Neurologic:  Normal speech and language. No gross focal neurologic deficits are appreciated. No gait instability. Skin: 7 cm laceration left lower leg.  Psychiatric: Mood and affect are normal. Speech and behavior are normal.  ____________________________________________   LABS (all labs ordered are listed, but only abnormal results are displayed)  Labs Reviewed - No data to display  ____________________________________________  EKG   ____________________________________________  RADIOLOGY  ED MD interpretation:    Official radiology report(s): No results found.  ____________________________________________   PROCEDURES  Procedure(s) performed (including Critical Care):  Marland KitchenMarland KitchenLaceration Repair  Date/Time: 06/06/2019 3:48 PM Performed by: Sable Feil, PA-C Authorized by: Sable Feil, PA-C   Consent:    Consent obtained:  Verbal   Consent given by:  Patient   Risks discussed:  Infection, pain and poor cosmetic result Anesthesia (see MAR for exact dosages):    Anesthesia method:  Local infiltration   Local anesthetic:  Lidocaine 1% w/o epi Laceration details:    Location:  Leg   Leg location:  L lower leg   Length (cm):  7 Repair type:    Repair type:  Simple Pre-procedure details:    Preparation:  Patient was prepped and draped in usual sterile fashion Exploration:    Wound exploration: wound explored through full range of motion  Contaminated: no   Treatment:    Area cleansed with:  Betadine and saline   Amount of cleaning:  Standard   Irrigation solution:  Sterile saline   Irrigation method:  Syringe   Visualized foreign bodies/material removed: no   Skin repair:    Repair method:  Sutures   Suture size:  3-0   Suture material:  Nylon   Suture technique:  Simple interrupted   Number of sutures:  14 Approximation:    Approximation:  Close Post-procedure details:    Dressing:  Antibiotic ointment and sterile dressing   Patient tolerance of procedure:  Tolerated well, no immediate complications     ____________________________________________   INITIAL IMPRESSION / ASSESSMENT AND PLAN / ED COURSE  As part of my medical decision making, I reviewed the following data [facility]         Patient presents with laceration to left lower leg.  Patient neurovascular intact with free and equal  range of motion.  Discussed rationale for suturing wound.  Patient given discharge care instruction advised take medication as directed.  Patient advised to have sutures removed in 7 to 10 days.      ____________________________________________   FINAL CLINICAL IMPRESSION(S) / ED DIAGNOSES  Final diagnoses:  Laceration of left lower extremity, initial encounter     ED Discharge Orders         Ordered    traMADol (ULTRAM) 50 MG tablet  Every 6 hours PRN     06/06/19 1616    sulfamethoxazole-trimethoprim (BACTRIM DS) 800-160 MG tablet  2 times daily     06/06/19 1616           Note:  This document was prepared using Dragon voice recognition software and may include unintentional dictation errors.    Sable Feil, PA-C 06/06/19 1617    Duffy Bruce, MD 06/07/19 [phone]

## 2019-06-06 NOTE — ED Triage Notes (Signed)
Pt states he was cutting a tree with a a chinsaw and it jumped off and cut the left anterior LE today. bandange in place on arrival. Bleeding I controlled , states he did take 1 percocet PTA

## 2019-06-06 NOTE — Telephone Encounter (Signed)
Agree with plan of care to present for EC.

## 2019-06-06 NOTE — ED Notes (Signed)
See triage note  Presents with laceration to left lower leg  States he was using a chain saw  And it jumped hitting left leg

## 2019-11-05 ENCOUNTER — Encounter: Payer: Self-pay | Admitting: Family Medicine

## 2019-11-05 ENCOUNTER — Other Ambulatory Visit: Payer: Self-pay

## 2019-11-05 ENCOUNTER — Ambulatory Visit (INDEPENDENT_AMBULATORY_CARE_PROVIDER_SITE_OTHER): Payer: PRIVATE HEALTH INSURANCE | Admitting: Family Medicine

## 2019-11-05 VITALS — BP 112/78 | HR 78 | Temp 97.3°F | Resp 14 | Ht 66.0 in | Wt 215.8 lb

## 2019-11-05 DIAGNOSIS — G4730 Sleep apnea, unspecified: Secondary | ICD-10-CM | POA: Diagnosis not present

## 2019-11-05 DIAGNOSIS — E669 Obesity, unspecified: Secondary | ICD-10-CM

## 2019-11-05 DIAGNOSIS — M545 Low back pain, unspecified: Secondary | ICD-10-CM

## 2019-11-05 DIAGNOSIS — Z0000 Encounter for general adult medical examination without abnormal findings: Secondary | ICD-10-CM

## 2019-11-05 DIAGNOSIS — Z23 Encounter for immunization: Secondary | ICD-10-CM

## 2019-11-05 DIAGNOSIS — Z6834 Body mass index (BMI) 34.0-34.9, adult: Secondary | ICD-10-CM

## 2019-11-05 DIAGNOSIS — G8929 Other chronic pain: Secondary | ICD-10-CM

## 2019-11-05 NOTE — Patient Instructions (Signed)
Preventive Care 51-51 Years Old, Male Preventive care refers to lifestyle choices and visits with your health care provider that can promote health and wellness. This includes:  A yearly physical exam. This is also called an annual well check.  Regular dental and eye exams.  Immunizations.  Screening for certain conditions.  Healthy lifestyle choices, such as eating a healthy diet, getting regular exercise, not using drugs or products that contain nicotine and tobacco, and limiting alcohol use. What can I expect for my preventive care visit? Physical exam Your health care provider will check:  Height and weight. These may be used to calculate body mass index (BMI), which is a measurement that tells if you are at a healthy weight.  Heart rate and blood pressure.  Your skin for abnormal spots. Counseling Your health care provider may ask you questions about:  Alcohol, tobacco, and drug use.  Emotional well-being.  Home and relationship well-being.  Sexual activity.  Eating habits.  Work and work environment. What immunizations do I need?  Influenza (flu) vaccine  This is recommended every year. Tetanus, diphtheria, and pertussis (Tdap) vaccine  You may need a Td booster every 10 years. Varicella (chickenpox) vaccine  You may need this vaccine if you have not already been vaccinated. Zoster (shingles) vaccine  You may need this after age 60. Measles, mumps, and rubella (MMR) vaccine  You may need at least one dose of MMR if you were born in 1957 or later. You may also need a second dose. Pneumococcal conjugate (PCV13) vaccine  You may need this if you have certain conditions and were not previously vaccinated. Pneumococcal polysaccharide (PPSV23) vaccine  You may need one or two doses if you smoke cigarettes or if you have certain conditions. Meningococcal conjugate (MenACWY) vaccine  You may need this if you have certain conditions. Hepatitis A vaccine   You may need this if you have certain conditions or if you travel or work in places where you may be exposed to hepatitis A. Hepatitis B vaccine  You may need this if you have certain conditions or if you travel or work in places where you may be exposed to hepatitis B. Haemophilus influenzae type b (Hib) vaccine  You may need this if you have certain risk factors. Human papillomavirus (HPV) vaccine  If recommended by your health care provider, you may need three doses over 6 months. You may receive vaccines as individual doses or as more than one vaccine together in one shot (combination vaccines). Talk with your health care provider about the risks and benefits of combination vaccines. What tests do I need? Blood tests  Lipid and cholesterol levels. These may be checked every 5 years, or more frequently if you are over 51 years old.  Hepatitis C test.  Hepatitis B test. Screening  Lung cancer screening. You may have this screening every year starting at age 51 if you have a 30-pack-year history of smoking and currently smoke or have quit within the past 15 years.  Prostate cancer screening. Recommendations will vary depending on your family history and other risks.  Colorectal cancer screening. All adults should have this screening starting at age 51 and continuing until age 75. Your health care provider may recommend screening at age 45 if you are at increased risk. You will have tests every 1-10 years, depending on your results and the type of screening test.  Diabetes screening. This is done by checking your blood sugar (glucose) after you have not eaten   for a while (fasting). You may have this done every 1-3 years.  Sexually transmitted disease (STD) testing. Follow these instructions at home: Eating and drinking  Eat a diet that includes fresh fruits and vegetables, whole grains, lean protein, and low-fat dairy products.  Take vitamin and mineral supplements as recommended  by your health care provider.  Do not drink alcohol if your health care provider tells you not to drink.  If you drink alcohol: ? Limit how much you have to 0-2 drinks a day. ? Be aware of how much alcohol is in your drink. In the [place] one drink equals one 12 oz bottle of beer (355 mL), one 5 oz glass of wine (148 mL), or one 1 oz glass of hard liquor (44 mL). Lifestyle  Take daily care of your teeth and gums.  Stay active. Exercise for at least 30 minutes on 5 or more days each week.  Do not use any products that contain nicotine or tobacco, such as cigarettes, e-cigarettes, and chewing tobacco. If you need help quitting, ask your health care provider.  If you are sexually active, practice safe sex. Use a condom or other form of protection to prevent STIs (sexually transmitted infections).  Talk with your health care provider about taking a low-dose aspirin every day starting at age 51. What's next?  Go to your health care provider once a year for a well check visit.  Ask your health care provider how often you should have your eyes and teeth checked.  Stay up to date on all vaccines. This information is not intended to replace advice given to you by your health care provider. Make sure you discuss any questions you have with your health care provider. Document Released: 01/01/2016 Document Revised: 11/29/2018 Document Reviewed: 11/29/2018 Elsevier Patient Education  2020 [employer].

## 2019-11-05 NOTE — Progress Notes (Signed)
Patient: Nicholas Caradine., Male    DOB: 09/25/1966, 53 y.o.   MRN: [id number] Delsa Grana, PA-C Visit Date: 11/05/2019  Today's Provider: Delsa Grana, PA-C   Chief Complaint  Patient presents with  . Annual Exam   Subjective:   Annual physical exam:  Nicholas Hoare. is a 53 y.o. male who presents today for health maintenance and annual & complete physical exam.   He feels fairly well, but is always in pain. He reports exercising not much, limited with chronic back pain Diet- has improved diet to overall more balanced and healthy with smaller portions He reports he is sleeping well - sleep well with CPAP and OSA   ED- improved, he believes it was a BP drug side effect, no refills needed  USPSTF grade A and B recommendations - reviewed and addressed today  Depression:  Phq 9 completed today by patient, was reviewed by me with patient in the room, score is  negative, pt feels overall mood is good. PHQ 2/9 Scores 11/05/2019 10/30/2018 09/26/2018  PHQ - 2 Score 0 0 6  PHQ- 9 Score 0 6 15   Depression screen [facility] 2/9 11/05/2019 10/30/2018 09/26/2018  Decreased Interest 0 0 3  Down, Depressed, Hopeless 0 0 3  PHQ - 2 Score 0 0 6  Altered sleeping 0 3 3  Tired, decreased energy 0 3 3  Change in appetite 0 0 0  Feeling bad or failure about yourself  0 0 3  Trouble concentrating 0 0 0  Moving slowly or fidgety/restless 0 0 0  Suicidal thoughts 0 0 0  PHQ-9 Score 0 6 15  Difficult doing work/chores Not difficult at all Not difficult at all Not difficult at all    Hep C Screening: not indicated STD testing and prevention (HIV/chl/gon/syphilis): none, declined Prostate cancer: Prostate cancer screening with PSA: Discussed risks and benefits of PSA testing and provided handout. Pt wishes not to have PSA drawn today. No results found for: PSA   Intimate partner violence:  Feels safe, no abuse  Urinary Symptoms:  IPSS Questionnaire (AUA-7): Pt did not want to  do UAU-7  Advanced Care Planning:  A voluntary discussion about advance care planning including the explanation and discussion of advance directives.  Discussed health care proxy and Living will, and the patient was able to identify a health care proxy as Nicholas Greene.  Patient does have a living will at present time. If patient does have living will, I have requested they bring this to the clinic to be scanned in to their chart.  Skin cancer:  last skin survey was.  Pt reports no hx of skin cancer, suspicious lesions/biopsies in the past.  Colorectal cancer:  colonoscopy is cologuard done 2019    Lung cancer: Low Dose CT Chest recommended if Age 72-80 years, 30 pack-year currently smoking OR have quit w/in 15years. Patient does not qualify.   Social History   Tobacco Use  . Smoking status: Current Every Day Smoker    Packs/day: 0.50    Years: 30.00    Pack years: 15.00    Types: Cigarettes  . Smokeless tobacco: Never Used  Substance Use Topics  . Alcohol use: No    Comment: occassionally but none since January     Alcohol screening:   Office Visit from 11/05/2019 in [facility]  AUDIT-C Score  1      AAA: n/a The USPSTF recommends one-time screening with ultrasonography in men  ages 15 to 7 years who have ever smoked  Blood pressure/Hypertension: BP Readings from Last 3 Encounters:  11/05/19 98/60  06/06/19 125/77  10/30/18 130/82   Weight/Obesity: Wt Readings from Last 3 Encounters:  11/05/19 215 lb 12.8 oz (97.9 kg)  06/06/19 220 lb (99.8 kg)  10/30/18 221 lb 12.8 oz (100.6 kg)   BMI Readings from Last 3 Encounters:  11/05/19 34.83 kg/m  06/06/19 35.51 kg/m  10/30/18 35.80 kg/m    Lipids:  Lab Results  Component Value Date   CHOL 248 (H) 09/26/2018   Lab Results  Component Value Date   HDL 49 09/26/2018   Lab Results  Component Value Date   LDLCALC 161 (H) 09/26/2018   Lab Results  Component Value Date   TRIG 211 (H)  09/26/2018   Lab Results  Component Value Date   CHOLHDL 5.1 (H) 09/26/2018   No results found for: LDLDIRECT Based on the results of lipid panel his/her cardiovascular risk factor ( using Newcomb )  in the next 10 years is : The 10-year ASCVD risk score Mikey Bussing DC Brooke Bonito., et al., 2013) is: 6.7%   Values used to calculate the score:     Age: 56 years     Sex: Male     Is Non-Hispanic African American: No     Diabetic: No     Tobacco smoker: Yes     Systolic Blood Pressure: 98 mmHg     Is BP treated: No     HDL Cholesterol: 49 mg/dL     Total Cholesterol: 248 mg/dL Glucose:  Glucose  Date Value Ref Range Status  10/03/2017 89 65 - 99 mg/dL Final  03/15/2013 208 (H) 65 - 99 mg/dL Final  03/14/2013 173 (H) 65 - 99 mg/dL Final  10/30/2012 158 (H) 65 - 99 mg/dL Final   Glucose, Bld  Date Value Ref Range Status  09/26/2018 91 65 - 139 mg/dL Final    Comment:    .        Non-fasting reference interval .   08/09/2017 122 (H) 65 - 99 mg/dL Final  08/03/2017 137 (H) 65 - 99 mg/dL Final    Social History      He  reports that he has been smoking cigarettes. He has a 15.00 pack-year smoking history. He has never used smokeless tobacco. He reports current drug use. Drug: Marijuana. He reports that he does not drink alcohol.       Social History   Socioeconomic History  . Marital status: Married    Spouse name: Nicholas Greene  . Number of children: Not on file  . Years of education: 60  . Highest education level: GED or equivalent  Occupational History  . Occupation: retired  Scientific laboratory technician  . Financial resource strain: Not hard at all  . Food insecurity    Worry: Never true    Inability: Never true  . Transportation needs    Medical: No    Non-medical: No  Tobacco Use  . Smoking status: Current Every Day Smoker    Packs/day: 0.50    Years: 30.00    Pack years: 15.00    Types: Cigarettes  . Smokeless tobacco: Never Used  Substance and Sexual Activity  . Alcohol use: No     Comment: occassionally but none since January  . Drug use: Yes    Types: Marijuana  . Sexual activity: Yes    Partners: Female  Lifestyle  . Physical activity    Days  per week: 1 day    Minutes per session: 30 min  . Stress: Rather much  Relationships  . Social connections    Talks on phone: More than three times a week    Gets together: More than three times a week    Attends religious service: More than 4 times per year    Active member of club or organization: No    Attends meetings of clubs or organizations: Never    Relationship status: Married  Other Topics Concern  . Not on file  Social History Narrative  . Not on file         Social History   Socioeconomic History  . Marital status: Married    Spouse name: Nicholas Greene  . Number of children: Not on file  . Years of education: 61  . Highest education level: GED or equivalent  Occupational History  . Occupation: retired  Scientific laboratory technician  . Financial resource strain: Not hard at all  . Food insecurity    Worry: Never true    Inability: Never true  . Transportation needs    Medical: No    Non-medical: No  Tobacco Use  . Smoking status: Current Every Day Smoker    Packs/day: 0.50    Years: 30.00    Pack years: 15.00    Types: Cigarettes  . Smokeless tobacco: Never Used  Substance and Sexual Activity  . Alcohol use: No    Comment: occassionally but none since January  . Drug use: Yes    Types: Marijuana  . Sexual activity: Yes    Partners: Female  Lifestyle  . Physical activity    Days per week: 1 day    Minutes per session: 30 min  . Stress: Rather much  Relationships  . Social connections    Talks on phone: More than three times a week    Gets together: More than three times a week    Attends religious service: More than 4 times per year    Active member of club or organization: No    Attends meetings of clubs or organizations: Never    Relationship status: Married  . Intimate partner violence    Fear  of current or ex partner: No    Emotionally abused: No    Physically abused: No    Forced sexual activity: No  Other Topics Concern  . Not on file  Social History Narrative  . Not on file    Family History        Family Status  Relation Name Status  . Mother  Deceased  . Father  Deceased  . MGM  Deceased  . MGF  Deceased  . PGM  Deceased  . PGF  Deceased  . Neg Hx  (Not Specified)        His family history includes Hematuria in his mother; Lung cancer in his mother; Prostate cancer in his father. There is no history of Kidney cancer or Bladder Cancer.       Family History  Problem Relation Age of Onset  . Hematuria Mother   . Lung cancer Mother   . Prostate cancer Father   . Kidney cancer Neg Hx   . Bladder Cancer Neg Hx     Patient Active Problem List   Diagnosis Date Noted  . Preventative health care 10/30/2018  . Failed back surgical syndrome 09/26/2018  . Morbid obesity ([place]) 09/26/2018  . Essential hypertension, benign 09/26/2018  . Hyperlipidemia 09/26/2018  .  Tobacco abuse 09/26/2018  . Lumbosacral spondylosis without myelopathy 08/11/2014  . Lumbar spondylosis 08/11/2014  . Sleep apnea treated with nocturnal BiPAP 11/07/2007    Past Surgical History:  Procedure Laterality Date  . BACK SURGERY     Jan 17 2014  . CYSTOSCOPY  2007  . CYSTOSCOPY/URETEROSCOPY/HOLMIUM LASER/STENT PLACEMENT Left 08/14/2017   Procedure: CYSTOSCOPY/URETEROSCOPY/HOLMIUM LASER/STENT PLACEMENT;  Surgeon: Hollice Espy, MD;  Location: [facility] ORS;  Service: Urology;  Laterality: Left;  Marland Kitchen MAXIMUM ACCESS (MAS)POSTERIOR LUMBAR INTERBODY FUSION (PLIF) 1 LEVEL N/A 08/11/2014   Procedure: FOR MAXIMUM ACCESS (MAS) POSTERIOR LUMBAR INTERBODY FUSION (PLIF) LUMBAR FIVE-SACRAL ONE;  Surgeon: Charlie Pitter, MD;  Location: [facility];  Service: Neurosurgery;  Laterality: N/A;  FOR MAXIMUM ACCESS (MAS) POSTERIOR LUMBAR INTERBODY FUSION (PLIF) LUMBAR FIVE-SACRAL ONE     Current Outpatient  Medications:  .  cyclobenzaprine (FLEXERIL) 10 MG tablet, Take 10 mg by mouth 2 (two) times daily as needed for muscle spasms. , Disp: , Rfl:  .  atorvastatin (LIPITOR) 10 MG tablet, Take 1 tablet (10 mg total) by mouth at bedtime. (Patient not taking: Reported on 11/05/2019), Disp: 30 tablet, Rfl: 1 .  DULoxetine (CYMBALTA) 30 MG capsule, Take 3 capsules (90 mg total) by mouth daily. (Patient not taking: Reported on 11/05/2019), Disp: 90 capsule, Rfl: 2 .  ibuprofen (ADVIL,MOTRIN) 200 MG tablet, Take 800 mg by mouth daily as needed for moderate pain., Disp: , Rfl:  .  loratadine-pseudoephedrine (CLARITIN-D 24-HOUR) 10-240 MG 24 hr tablet, Take 1 tablet by mouth daily., Disp: , Rfl:  .  oxyCODONE-acetaminophen (PERCOCET) 10-325 MG tablet, Take 1 tablet by mouth every 8 (eight) hours as needed for pain. , Disp: , Rfl:  .  sildenafil (VIAGRA) 50 MG tablet, Take 0.5-1 tablets (25-50 mg total) by mouth daily as needed for erectile dysfunction. (Patient not taking: Reported on 11/05/2019), Disp: 10 tablet, Rfl: 11 .  sulfamethoxazole-trimethoprim (BACTRIM DS) 800-160 MG tablet, Take 1 tablet by mouth 2 (two) times daily. (Patient not taking: Reported on 11/05/2019), Disp: 20 tablet, Rfl: 0 .  traMADol (ULTRAM) 50 MG tablet, Take 1 tablet (50 mg total) by mouth every 6 (six) hours as needed. (Patient not taking: Reported on 11/05/2019), Disp: 20 tablet, Rfl: 0  Allergies  Allergen Reactions  . Cortisone Acetate [Cortisone] Other (See Comments)    Shots caused redness and swelling at injection sites    Patient Care Team: Delsa Grana, PA-C as PCP - General (Family Medicine)  I personally reviewed active problem list, medication list, allergies, family history, social history, health maintenance, notes from last encounter, lab results, imaging with the patient/caregiver today.  Review of Systems  Constitutional: Negative.  Negative for activity change, appetite change, fatigue and unexpected weight  change.  HENT: Negative.   Eyes: Negative.   Respiratory: Negative.  Negative for shortness of breath.   Cardiovascular: Negative.  Negative for chest pain, palpitations and leg swelling.  Gastrointestinal: Negative.  Negative for abdominal pain and blood in stool.  Endocrine: Negative.   Genitourinary: Negative.  Negative for decreased urine volume, difficulty urinating, testicular pain and urgency.  Musculoskeletal: Negative.   Skin: Negative.  Negative for color change and pallor.  Allergic/Immunologic: Negative.   Neurological: Negative.  Negative for syncope, weakness, light-headedness and numbness.  Hematological: Negative.   Psychiatric/Behavioral: Negative.  Negative for confusion, dysphoric mood, self-injury and suicidal ideas. The patient is not nervous/anxious.   All other systems reviewed and are negative.  Objective:   Vitals:  Vitals:   11/05/19 0906  BP: 98/60  Pulse: 78  Resp: 14  Temp: (!) 97.3 F (36.3 C)  SpO2: 97%  Weight: 215 lb 12.8 oz (97.9 kg)  Height: 5' 6"  (1.676 m)    Body mass index is 34.83 kg/m.  Physical Exam Vitals signs and nursing note reviewed.  Constitutional:      General: He is not in acute distress.    Appearance: Normal appearance. He is well-developed. He is obese. He is not ill-appearing, toxic-appearing or diaphoretic.     Interventions: Face mask in place.  HENT:     Head: Normocephalic and atraumatic.     Jaw: No trismus.     Right Ear: External ear normal.     Left Ear: External ear normal.  Eyes:     General: Lids are normal. No scleral icterus.    Conjunctiva/sclera: Conjunctivae normal.     Pupils: Pupils are equal, round, and reactive to light.  Neck:     Musculoskeletal: Normal range of motion and neck supple.     Trachea: Trachea and phonation normal. No tracheal deviation.  Cardiovascular:     Rate and Rhythm: Normal rate and regular rhythm.     Pulses: Normal pulses.          Radial pulses are 2+ on  the right side and 2+ on the left side.       Posterior tibial pulses are 2+ on the right side and 2+ on the left side.     Heart sounds: Normal heart sounds. No murmur. No friction rub. No gallop.   Pulmonary:     Effort: Pulmonary effort is normal. No respiratory distress.     Breath sounds: Normal breath sounds. No stridor. No wheezing, rhonchi or rales.  Abdominal:     General: Bowel sounds are normal. There is no distension.     Palpations: Abdomen is soft.     Tenderness: There is no abdominal tenderness. There is no guarding or rebound.  Musculoskeletal: Normal range of motion.     Right lower leg: No edema.     Left lower leg: No edema.  Skin:    General: Skin is warm and dry.     Capillary Refill: Capillary refill takes less than 2 seconds.     Coloration: Skin is not jaundiced.     Findings: No rash.     Nails: There is no clubbing.   Neurological:     Mental Status: He is alert.     Cranial Nerves: No dysarthria or facial asymmetry.     Motor: No tremor or abnormal muscle tone.     Gait: Gait normal.  Psychiatric:        Mood and Affect: Mood normal.        Speech: Speech normal.        Behavior: Behavior normal. Behavior is cooperative.      No results found for this or any previous visit (from the past 2160 hour(s)).  Diabetic Foot Exam: Diabetic Foot Exam - Simple   No data filed      PHQ2/9: Depression screen [facility] 2/9 11/05/2019 10/30/2018 09/26/2018  Decreased Interest 0 0 3  Down, Depressed, Hopeless 0 0 3  PHQ - 2 Score 0 0 6  Altered sleeping 0 3 3  Tired, decreased energy 0 3 3  Change in appetite 0 0 0  Feeling bad or failure about yourself  0 0 3  Trouble concentrating  0 0 0  Moving slowly or fidgety/restless 0 0 0  Suicidal thoughts 0 0 0  PHQ-9 Score 0 6 15  Difficult doing work/chores Not difficult at all Not difficult at all Not difficult at all    Fall Risk: Fall Risk  11/05/2019 10/30/2018 09/26/2018  Falls in the past year? - 1 Yes   Number falls in past yr: 0 1 2 or more  Injury with Fall? 0 0 No    Functional Status Survey: Is the patient deaf or have difficulty hearing?: No Does the patient have difficulty seeing, even when wearing glasses/contacts?: Yes Does the patient have difficulty concentrating, remembering, or making decisions?: No Does the patient have difficulty walking or climbing stairs?: No Does the patient have difficulty dressing or bathing?: No Does the patient have difficulty doing errands alone such as visiting a doctor's office or shopping?: No   Assessment & Plan:    CPE completed today  . Prostate cancer screening and PSA options (with potential risks and benefits of testing vs not testing) were discussed along with recent recs/guidelines, shared decision making and handout/information given to pt today  . USPSTF grade A and B recommendations reviewed with patient; age-appropriate recommendations, preventive care, screening tests, etc discussed and encouraged; healthy living encouraged; see AVS for patient education given to patient  . Discussed importance of 150 minutes of physical activity weekly, AHA exercise recommendations given to pt in AVS/handout  . Discussed importance of healthy diet:  eating lean meats and proteins, avoiding trans fats and saturated fats, avoid simple sugars and excessive carbs in diet, eat 6 servings of fruit/vegetables daily and drink plenty of water and avoid sweet beverages.  DASH diet reviewed if pt has HTN  . Recommended pt to do annual eye exam and routine dental exams/cleanings  . Reviewed Health Maintenance: Health Maintenance  Topic Date Due  . HIV Screening  11/19/1983  . INFLUENZA VACCINE  07/20/2019  . Fecal DNA (Cologuard)  11/05/2021  . TETANUS/TDAP  06/05/2029    . Immunizations: Immunization History  Administered Date(s) Administered  . Influenza,inj,Quad PF,6+ Mos 09/26/2018, 11/05/2019  . Tdap 09/26/2018, 06/06/2019      ICD-10-CM    1. Adult general medical exam  Z00.00 CBC w/ Diff    CMP w GFR    Lipid Panel    A1C  2. Sleep apnea treated with nocturnal BiPAP  G47.30    compliant  3. Chronic bilateral low back pain, unspecified whether sciatica present  M54.5    G89.29    offered to do other pain management which he may benefit from with chronic back pain - discussed NSAIDs, cymbalta, lyrica - options other than narcs  4. Class 1 obesity with serious comorbidity and body mass index (BMI) of 34.0 to 34.9 in adult, unspecified obesity type  E66.9    Z68.34    weight gradually improving, encouraged to continue lifestyle changes, healthy diet and increase exercise as able with pain  5. Need for immunization against influenza  Z23 Flu Vaccine QUAD 36+ mos IM   Of note patient's blood pressure was low today but he felt good not having any symptoms of hypertensions or orthostasis.     Delsa Grana, PA-C 11/05/19 9:36 AM  [facility]

## 2019-11-06 LAB — COMPLETE METABOLIC PANEL WITH GFR
AG Ratio: 2 (calc) (ref 1.0–2.5)
ALT: 27 U/L (ref 9–46)
AST: 16 U/L (ref 10–35)
Albumin: 4.5 g/dL (ref 3.6–5.1)
Alkaline phosphatase (APISO): 66 U/L (ref 35–144)
BUN: 13 mg/dL (ref 7–25)
CO2: 29 mmol/L (ref 20–32)
Calcium: 9.2 mg/dL (ref 8.6–10.3)
Chloride: 105 mmol/L (ref 98–110)
Creat: 0.86 mg/dL (ref 0.70–1.33)
GFR, Est African American: 117 mL/min/{1.73_m2} (ref >=60)
GFR, Est Non African American: 101 mL/min/{1.73_m2} (ref >=60)
Globulin: 2.3 g/dL (calc) (ref 1.9–3.7)
Glucose, Bld: 87 mg/dL (ref 65–99)
Potassium: 4.8 mmol/L (ref 3.5–5.3)
Sodium: 140 mmol/L (ref 135–146)
Total Bilirubin: 0.6 mg/dL (ref 0.2–1.2)
Total Protein: 6.8 g/dL (ref 6.1–8.1)

## 2019-11-06 LAB — LIPID PANEL
Cholesterol: 226 mg/dL — ABNORMAL HIGH (ref <200)
HDL: 46 mg/dL (ref >=40)
LDL Cholesterol (Calc): 149 mg/dL (calc) — ABNORMAL HIGH
Non-HDL Cholesterol (Calc): 180 mg/dL (calc) — ABNORMAL HIGH (ref <130)
Total CHOL/HDL Ratio: 4.9 (calc) (ref <5.0)
Triglycerides: 172 mg/dL — ABNORMAL HIGH (ref <150)

## 2019-11-06 LAB — CBC WITH DIFFERENTIAL/PLATELET
Absolute Monocytes: 644 cells/uL (ref 200–950)
Basophils Absolute: 79 cells/uL (ref 0–200)
Basophils Relative: 0.8 %
Eosinophils Absolute: 208 cells/uL (ref 15–500)
Eosinophils Relative: 2.1 %
HCT: 47.6 % (ref 38.5–50.0)
Hemoglobin: 16.6 g/dL (ref 13.2–17.1)
Lymphs Abs: 3277 cells/uL (ref 850–3900)
MCH: 30.1 pg (ref 27.0–33.0)
MCHC: 34.9 g/dL (ref 32.0–36.0)
MCV: 86.4 fL (ref 80.0–100.0)
MPV: 11.2 fL (ref 7.5–12.5)
Monocytes Relative: 6.5 %
Neutro Abs: 5693 cells/uL (ref 1500–7800)
Neutrophils Relative %: 57.5 %
Platelets: 168 10*3/uL (ref 140–400)
RBC: 5.51 10*6/uL (ref 4.20–5.80)
RDW: 13.9 % (ref 11.0–15.0)
Total Lymphocyte: 33.1 %
WBC: 9.9 10*3/uL (ref 3.8–10.8)

## 2019-11-06 LAB — HEMOGLOBIN A1C
Hgb A1c MFr Bld: 5.7 % of total Hgb — ABNORMAL HIGH (ref <5.7)
Mean Plasma Glucose: 117 (calc)
eAG (mmol/L): 6.5 (calc)

## 2019-11-11 ENCOUNTER — Encounter: Payer: Self-pay | Admitting: Family Medicine

## 2019-11-11 MED ORDER — ROSUVASTATIN CALCIUM 10 MG PO TABS: ORAL_TABLET | ORAL | 3 refills | Status: DC

## 2021-08-26 ENCOUNTER — Telehealth: Payer: PRIVATE HEALTH INSURANCE | Admitting: Nurse Practitioner

## 2021-08-26 ENCOUNTER — Ambulatory Visit: Admission: RE | Admit: 2021-08-26 | Payer: No Typology Code available for payment source | Source: Home / Self Care

## 2021-08-26 ENCOUNTER — Encounter: Payer: Self-pay | Admitting: Nurse Practitioner

## 2021-08-26 ENCOUNTER — Encounter: Admission: RE | Payer: Self-pay | Source: Home / Self Care

## 2021-08-26 DIAGNOSIS — J0140 Acute pansinusitis, unspecified: Secondary | ICD-10-CM | POA: Diagnosis not present

## 2021-08-26 SURGERY — COLONOSCOPY WITH PROPOFOL
Anesthesia: General

## 2021-08-26 MED ORDER — AMOXICILLIN-POT CLAVULANATE 875-125 MG PO TABS: 1.0000 | ORAL_TABLET | Freq: Two times a day (BID) | ORAL | 0 refills | Status: AC

## 2021-08-26 NOTE — Progress Notes (Signed)
Virtual Visit Consent   Nicholas Greene., you are scheduled for a virtual visit with a [facility] provider today.     Just as with appointments in the office, your consent must be obtained to participate.  Your consent will be active for this visit and any virtual visit you may have with one of our providers in the next 365 days.     If you have a MyChart account, a copy of this consent can be sent to you electronically.  All virtual visits are billed to your insurance company just like a traditional visit in the office.    As this is a virtual visit, video technology does not allow for your provider to perform a traditional examination.  This may limit your provider's ability to fully assess your condition.  If your provider identifies any concerns that need to be evaluated in person or the need to arrange testing (such as labs, EKG, etc.), we will make arrangements to do so.     Although advances in technology are sophisticated, we cannot ensure that it will always work on either your end or our end.  If the connection with a video visit is poor, the visit may have to be switched to a telephone visit.  With either a video or telephone visit, we are not always able to ensure that we have a secure connection.     I need to obtain your verbal consent now.   Are you willing to proceed with your visit today?    [name] has provided verbal consent on 08/26/2021 for a virtual visit (telephone). Failed to connect over video, patient is agreeable to continue with telephone visit.    [name], FNP   Date: 08/26/2021 7:15 PM   Virtual Visit via Video Note   I, [name], connected with  Nicholas Greene.  ([id number], [birth year]) on 08/26/21 at  7:00 PM EDT by telephone and verified that I am speaking with the correct person using two identifiers.  Location: Patient: Virtual Visit Location Patient: Home Provider: Virtual Visit Location Provider: Office/Clinic   I  discussed the limitations of evaluation and management by telemedicine and the availability of in person appointments. The patient expressed understanding and agreed to proceed.    History of Present Illness: Nicholas Greene. is a 55 y.o. who identifies as a male who was assigned male at birth, and is being seen today for sinus pain and congestion. He denies cough or fever. Symptoms have been present for over a week with worsening symptoms in the past 3 days.  Uses CPAP that worsens the congestion at night Has tried Flonase and sudafed with worsening symptoms over the past three days evidenced by pain radiating into teeth   Has had sinus infections in the past   Problems:  Patient Active Problem List   Diagnosis Date Noted   Preventative health care 10/30/2018   Failed back surgical syndrome 09/26/2018   Morbid obesity ([place]) 09/26/2018   Essential hypertension, benign 09/26/2018   Hyperlipidemia 09/26/2018   Tobacco abuse 09/26/2018   Lumbosacral spondylosis without myelopathy 08/11/2014   Lumbar spondylosis 08/11/2014   Sleep apnea treated with nocturnal BiPAP 11/07/2007    Allergies:  Allergies  Allergen Reactions   Cortisone Acetate [Cortisone] Other (See Comments)    Shots caused redness and swelling at injection sites   Medications:  Current Outpatient Medications  Medication Instructions   amoxicillin-clavulanate (AUGMENTIN) 875-125 MG tablet 1 tablet, Oral,  2 times daily, Take with food   cyclobenzaprine (FLEXERIL) 10 mg, Oral, 2 times daily PRN   ibuprofen (ADVIL) 800 mg, Oral, Daily PRN   loratadine-pseudoephedrine (CLARITIN-D 24-HOUR) 10-240 MG 24 hr tablet 1 tablet, Oral, Daily   oxyCODONE-acetaminophen (PERCOCET) 10-325 MG tablet 1 tablet, Oral, Every 8 hours PRN   rosuvastatin (CRESTOR) 10 MG tablet Take 10 mg PO three times a week at bedtime     Observations/Objective:  No labored breathing.  Speech is clear and coherent with logical content.  Patient is  alert and oriented at baseline.    Assessment and Plan: 1. Acute non-recurrent pansinusitis  - amoxicillin-clavulanate (AUGMENTIN) 875-125 MG tablet; Take 1 tablet by mouth 2 (two) times daily for 7 days. Take with food  Dispense: 14 tablet; Refill: 0    Continue sudafed and flonase as needed until congestion resolves Take antibiotic with food   Follow Up Instructions: I discussed the assessment and treatment plan with the patient. The patient was provided an opportunity to ask questions and all were answered. The patient agreed with the plan and demonstrated an understanding of the instructions.  A copy of instructions were sent to the patient via MyChart.  The patient was advised to call back or seek an in-person evaluation if the symptoms worsen or if the condition fails to improve as anticipated.  Time:  I spent 10 minutes with the patient via telehealth technology discussing the above problems/concerns.    [name], FNP

## 2022-04-27 ENCOUNTER — Other Ambulatory Visit: Payer: Self-pay | Admitting: Physician Assistant

## 2022-04-27 DIAGNOSIS — M75122 Complete rotator cuff tear or rupture of left shoulder, not specified as traumatic: Secondary | ICD-10-CM

## 2022-04-30 ENCOUNTER — Ambulatory Visit
Admission: RE | Admit: 2022-04-30 | Discharge: 2022-04-30 | Disposition: A | Discharge status: Home / Self Care | Payer: No Typology Code available for payment source | Source: Ambulatory Visit | Attending: Physician Assistant | Admitting: Physician Assistant

## 2022-04-30 DIAGNOSIS — M75122 Complete rotator cuff tear or rupture of left shoulder, not specified as traumatic: Secondary | ICD-10-CM | POA: Diagnosis present

## 2022-06-07 ENCOUNTER — Other Ambulatory Visit: Payer: Self-pay | Admitting: Orthopedic Surgery

## 2022-06-13 ENCOUNTER — Encounter: Payer: Self-pay | Admitting: Orthopedic Surgery

## 2022-06-24 ENCOUNTER — Ambulatory Visit: Payer: 59 | Admitting: Anesthesiology

## 2022-06-24 ENCOUNTER — Ambulatory Visit
Admission: RE | Admit: 2022-06-24 | Discharge: 2022-06-24 | Disposition: A | Discharge status: Home / Self Care | Payer: 59 | Attending: Orthopedic Surgery | Admitting: Orthopedic Surgery

## 2022-06-24 ENCOUNTER — Other Ambulatory Visit: Payer: Self-pay

## 2022-06-24 ENCOUNTER — Encounter
Admission: RE | Disposition: A | Discharge status: Home / Self Care | Payer: Self-pay | Source: Home / Self Care | Attending: Orthopedic Surgery

## 2022-06-24 ENCOUNTER — Encounter: Payer: Self-pay | Admitting: Orthopedic Surgery

## 2022-06-24 DIAGNOSIS — M25812 Other specified joint disorders, left shoulder: Secondary | ICD-10-CM | POA: Diagnosis not present

## 2022-06-24 DIAGNOSIS — M75102 Unspecified rotator cuff tear or rupture of left shoulder, not specified as traumatic: Secondary | ICD-10-CM | POA: Diagnosis not present

## 2022-06-24 DIAGNOSIS — M19012 Primary osteoarthritis, left shoulder: Secondary | ICD-10-CM | POA: Diagnosis present

## 2022-06-24 DIAGNOSIS — F17210 Nicotine dependence, cigarettes, uncomplicated: Secondary | ICD-10-CM | POA: Insufficient documentation

## 2022-06-24 HISTORY — PX: SHOULDER ARTHROSCOPY: SHX128

## 2022-06-24 HISTORY — DX: Unspecified osteoarthritis, unspecified site: M19.90

## 2022-06-24 SURGERY — ARTHROSCOPY, SHOULDER
Anesthesia: Regional | Site: Shoulder | Laterality: Left

## 2022-06-24 MED ORDER — BUPIVACAINE LIPOSOME 1.3 % IJ SUSP
INTRAMUSCULAR | Status: DC | PRN
  Administered 2022-06-24: 20 mL

## 2022-06-24 MED ORDER — OXYCODONE HCL 5 MG PO TABS: 5.0000 mg | ORAL_TABLET | ORAL | 0 refills | Status: AC | PRN

## 2022-06-24 MED ORDER — OXYCODONE HCL 5 MG/5ML PO SOLN: 5.0000 mg | Freq: Once | ORAL | Status: DC | PRN

## 2022-06-24 MED ORDER — FENTANYL CITRATE (PF) 100 MCG/2ML IJ SOLN
INTRAMUSCULAR | Status: DC | PRN
Start: 2022-06-24 — End: 2022-06-24
  Administered 2022-06-24: 100 ug via INTRAVENOUS

## 2022-06-24 MED ORDER — LACTATED RINGERS IV SOLN: INTRAVENOUS | Status: DC

## 2022-06-24 MED ORDER — RINGERS IRRIGATION IR SOLN
Status: DC | PRN
  Administered 2022-06-24: 9000 mL
  Administered 2022-06-24: 12000 mL
  Administered 2022-06-24: 6000 mL

## 2022-06-24 MED ORDER — ONDANSETRON HCL 4 MG/2ML IJ SOLN
INTRAMUSCULAR | Status: DC | PRN
  Administered 2022-06-24: 4 mg via INTRAVENOUS

## 2022-06-24 MED ORDER — DEXMEDETOMIDINE (PRECEDEX) IN NS 20 MCG/5ML (4 MCG/ML) IV SYRINGE
PREFILLED_SYRINGE | INTRAVENOUS | Status: DC | PRN
  Administered 2022-06-24: 10 ug via INTRAVENOUS

## 2022-06-24 MED ORDER — ACETAMINOPHEN 500 MG PO TABS: 1000.0000 mg | ORAL_TABLET | Freq: Three times a day (TID) | ORAL | 2 refills | Status: AC

## 2022-06-24 MED ORDER — VANCOMYCIN HCL IN DEXTROSE 1-5 GM/200ML-% IV SOLN
1000.0000 mg | INTRAVENOUS | Status: AC
  Administered 2022-06-24: 1000 mg via INTRAVENOUS

## 2022-06-24 MED ORDER — ONDANSETRON 4 MG PO TBDP: 4.0000 mg | ORAL_TABLET | Freq: Three times a day (TID) | ORAL | 0 refills | Status: DC | PRN

## 2022-06-24 MED ORDER — MIDAZOLAM HCL 5 MG/5ML IJ SOLN
INTRAMUSCULAR | Status: DC | PRN
  Administered 2022-06-24: 2 mg via INTRAVENOUS

## 2022-06-24 MED ORDER — ONDANSETRON HCL 4 MG/2ML IJ SOLN: 4.0000 mg | Freq: Once | INTRAMUSCULAR | Status: DC | PRN

## 2022-06-24 MED ORDER — LIDOCAINE HCL (CARDIAC) PF 100 MG/5ML IV SOSY
PREFILLED_SYRINGE | INTRAVENOUS | Status: DC | PRN
  Administered 2022-06-24: 60 mg via INTRATRACHEAL

## 2022-06-24 MED ORDER — BUPIVACAINE HCL (PF) 0.5 % IJ SOLN
INTRAMUSCULAR | Status: DC | PRN
  Administered 2022-06-24: 20 mL

## 2022-06-24 MED ORDER — FENTANYL CITRATE PF 50 MCG/ML IJ SOSY: 25.0000 ug | PREFILLED_SYRINGE | INTRAMUSCULAR | Status: DC | PRN

## 2022-06-24 MED ORDER — PROPOFOL 10 MG/ML IV BOLUS
INTRAVENOUS | Status: DC | PRN
  Administered 2022-06-24: 200 mg via INTRAVENOUS

## 2022-06-24 MED ORDER — ASPIRIN 325 MG PO TBEC: 325.0000 mg | DELAYED_RELEASE_TABLET | Freq: Every day | ORAL | 0 refills | Status: AC

## 2022-06-24 MED ORDER — OXYCODONE HCL 5 MG PO TABS: 5.0000 mg | ORAL_TABLET | Freq: Once | ORAL | Status: DC | PRN

## 2022-06-24 MED ORDER — EPINEPHRINE PF 1 MG/ML IJ SOLN
INTRAMUSCULAR | Status: DC | PRN
  Administered 2022-06-24: 4 mL

## 2022-06-24 SURGICAL SUPPLY — 50 items
ADAPTER IRRIG TUBE 2 SPIKE SOL (ADAPTER) ×2 IMPLANT
ADPR TBG 2 SPK PMP STRL ASCP (ADAPTER) ×1
ANCH SUT 2 SWLK 19.1 CLS EYLT (Anchor) ×1 IMPLANT
ANCH SUT 2.9 PUSHLOCK ANCH (Orthopedic Implant) ×1 IMPLANT
ANCHOR ICONIX SPEED 2.3 (Anchor) ×1 IMPLANT
ANCHOR SWIVELOCK BIO 4.75X19.1 (Anchor) ×1 IMPLANT
APL PRP STRL LF DISP 70% ISPRP (MISCELLANEOUS) ×1
BLADE SHAVER 4.5X7 STR FR (MISCELLANEOUS) ×2 IMPLANT
BUR BR 5.5 WIDE MOUTH (BURR) ×2 IMPLANT
CANNULA PART THRD DISP 5.75X7 (CANNULA) ×2 IMPLANT
CANNULA TWIST IN 8.25X9CM (CANNULA) ×1 IMPLANT
CHLORAPREP W/TINT 26 (MISCELLANEOUS) ×2 IMPLANT
COOLER POLAR GLACIER W/PUMP (MISCELLANEOUS) ×2 IMPLANT
DEVICE SUCT BLK HOLE OR FLOOR (MISCELLANEOUS) ×1 IMPLANT
DRAPE U 60X70 (DRAPES) ×4 IMPLANT
DRSG TEGADERM 4X4.75 (GAUZE/BANDAGES/DRESSINGS) ×2 IMPLANT
ELECT REM PT RETURN 9FT ADLT (ELECTROSURGICAL) ×2
ELECTRODE REM PT RTRN 9FT ADLT (ELECTROSURGICAL) ×1 IMPLANT
GAUZE SPONGE 4X4 12PLY STRL (GAUZE/BANDAGES/DRESSINGS) ×2 IMPLANT
GAUZE XEROFORM 1X8 LF (GAUZE/BANDAGES/DRESSINGS) ×2 IMPLANT
GLOVE SRG 8 PF TXTR STRL LF DI (GLOVE) ×1 IMPLANT
GLOVE SURG ENC MOIS LTX SZ7.5 (GLOVE) ×5 IMPLANT
GLOVE SURG ENC MOIS LTX SZ8 (GLOVE) ×2 IMPLANT
GLOVE SURG UNDER POLY LF SZ8 (GLOVE) ×2
GOWN STRL REUS W/ TWL LRG LVL3 (GOWN DISPOSABLE) ×1 IMPLANT
GOWN STRL REUS W/ TWL XL LVL3 (GOWN DISPOSABLE) ×1 IMPLANT
GOWN STRL REUS W/TWL LRG LVL3 (GOWN DISPOSABLE) ×2
GOWN STRL REUS W/TWL XL LVL3 (GOWN DISPOSABLE) ×2
IV LACTATED RINGER IRRG 3000ML (IV SOLUTION) ×18
IV LR IRRIG 3000ML ARTHROMATIC (IV SOLUTION) ×6 IMPLANT
KIT STABILIZATION SHOULDER (MISCELLANEOUS) ×2 IMPLANT
KIT TURNOVER KIT A (KITS) ×2 IMPLANT
MANIFOLD NEPTUNE II (INSTRUMENTS) ×3 IMPLANT
MASK FACE SPIDER DISP (MASK) ×2 IMPLANT
MAT ABSORB  FLUID 56X50 GRAY (MISCELLANEOUS) ×2
MAT ABSORB FLUID 56X50 GRAY (MISCELLANEOUS) ×1 IMPLANT
NDL SAFETY ECLIPSE 18X1.5 (NEEDLE) ×1 IMPLANT
NEEDLE HYPO 18GX1.5 SHARP (NEEDLE) ×2
PACK ARTHROSCOPY SHOULDER (MISCELLANEOUS) ×2 IMPLANT
PAD ABD DERMACEA PRESS 5X9 (GAUZE/BANDAGES/DRESSINGS) ×1 IMPLANT
PAD WRAPON POLAR SHDR XLG (MISCELLANEOUS) ×1 IMPLANT
PASSER SUT FIRSTPASS SELF (INSTRUMENTS) ×1 IMPLANT
SUT ETHILON 3-0 FS-10 30 BLK (SUTURE) ×2
SUTURE EHLN 3-0 FS-10 30 BLK (SUTURE) IMPLANT
SYR 5ML LL (SYRINGE) ×2 IMPLANT
SYSTEM IMPL TENODESIS LNT 2.9 (Orthopedic Implant) ×1 IMPLANT
TUBING INFLOW SET DBFLO PUMP (TUBING) ×2 IMPLANT
TUBING OUTFLOW SET DBLFO PUMP (TUBING) ×2 IMPLANT
WAND WEREWOLF FLOW 90D (MISCELLANEOUS) ×2 IMPLANT
WRAPON POLAR PAD SHDR XLG (MISCELLANEOUS) ×2

## 2022-06-24 NOTE — Anesthesia Procedure Notes (Signed)
Procedure Name: LMA Insertion Date/Time: 06/24/2022 7:46 AM  Performed by: Lily Kocher, CRNAPre-anesthesia Checklist: Patient identified, Patient being monitored, Timeout performed, Emergency Drugs available and Suction available Patient Re-evaluated:Patient Re-evaluated prior to induction Oxygen Delivery Method: Circle system utilized Preoxygenation: Pre-oxygenation with 100% oxygen Induction Type: IV induction Ventilation: Mask ventilation without difficulty LMA: LMA inserted LMA Size: 5.0 Number of attempts: 1 Placement Confirmation: positive ETCO2 and breath sounds checked- equal and bilateral Tube secured with: Tape Dental Injury: Teeth and Oropharynx as per pre-operative assessment

## 2022-06-24 NOTE — Anesthesia Procedure Notes (Signed)
Anesthesia Regional Block: Interscalene brachial plexus block   Pre-Anesthetic Checklist: , timeout performed,  Correct Patient, Correct Site, Correct Laterality,  Correct Procedure, Correct Position, site marked,  Risks and benefits discussed,  Surgical consent,  Pre-op evaluation,  At surgeon's request and post-op pain management  Laterality: Left  Prep: chloraprep       Needles:  Injection technique: Single-shot  Needle Type: Stimiplex     Needle Length: 10cm  Needle Gauge: 21     Additional Needles:   Procedures:,,,, ultrasound used (permanent image in chart),,    Narrative:  Start time: 06/24/2022 6:53 AM End time: 06/24/2022 6:58 AM Injection made incrementally with aspirations every 5 mL.  Performed by: Personally  Anesthesiologist: Fletcher Anon, MD  Additional Notes: Functioning IV was confirmed and monitors applied. Ultrasound guidance: relevant anatomy identified, needle position confirmed, local anesthetic spread visualized around nerve(s)., vascular puncture avoided.  Image printed for medical record.  Negative aspiration and no paresthesias; incremental administration of local anesthetic. The patient tolerated the procedure well. Vitals signes recorded in RN notes.

## 2022-06-24 NOTE — H&P (Deleted)
Paper H&P to be scanned into permanent record. H&P reviewed. No significant changes noted.  

## 2022-06-24 NOTE — H&P (Signed)
Paper H&P to be scanned into permanent record. H&P reviewed. No significant changes noted.  

## 2022-06-24 NOTE — Discharge Instructions (Signed)

## 2022-06-24 NOTE — Anesthesia Preprocedure Evaluation (Addendum)
Anesthesia Evaluation  Patient identified by MRN, date of birth, ID band Patient awake    Airway Mallampati: II  TM Distance: >3 FB Neck ROM: Full    Dental no notable dental hx.    Pulmonary sleep apnea and Continuous Positive Airway Pressure Ventilation , Current Smoker (0.5ppd)Patient did not abstain from smoking.,    Pulmonary exam normal        Cardiovascular hypertension, Normal cardiovascular exam     Neuro/Psych Anxiety negative psych ROS   GI/Hepatic Neg liver ROS, GERD  Controlled,  Endo/Other  BMI 34  Renal/GU Renal disease     Musculoskeletal  (+) Arthritis , Osteoarthritis,    Abdominal (+) + obese,   Peds  Hematology negative hematology ROS (+)   Anesthesia Other Findings   Reproductive/Obstetrics                            Anesthesia Physical Anesthesia Plan  ASA: 2  Anesthesia Plan: General and Regional   Post-op Pain Management: Regional block   Induction: Intravenous  PONV Risk Score and Plan: 1 and Midazolam, Ondansetron and Treatment may vary due to age or medical condition  Airway Management Planned: LMA  Additional Equipment:   Intra-op Plan:   Post-operative Plan: Extubation in OR  Informed Consent: I have reviewed the patients History and Physical, chart, labs and discussed the procedure including the risks, benefits and alternatives for the proposed anesthesia with the patient or authorized representative who has indicated his/her understanding and acceptance.     Dental advisory given  Plan Discussed with: CRNA  Anesthesia Plan Comments: (Interscalene nerve block with Exparel for postoperative pain control)      Anesthesia Quick Evaluation

## 2022-06-24 NOTE — Op Note (Signed)
SURGERY DATE: 06/24/2022   PRE-OP DIAGNOSIS:  1. Left subacromial impingement 2. Left biceps tendinopathy 3. Left rotator cuff tear 4. Left acromioclavicular joint arthritis   POST-OP DIAGNOSIS: 1. Left subacromial impingement 2. Left biceps tendinopathy 3. Left rotator cuff tear 4. Left acromioclavicular joint arthritis   PROCEDURES:  1. Left arthroscopic rotator cuff repair 2. Left arthroscopic biceps tenodesis 3. Left arthroscopic subacromial decompression 4. Left arthroscopic extensive debridement of shoulder (glenohumeral and subacromial spaces) 5. Left arthroscopic distal clavicle excision   SURGEON: Cato Mulligan, MD   ASSISTANT: Anitra Lauth, PA   ANESTHESIA: Gen with Exparel interscalene block   ESTIMATED BLOOD LOSS: 5cc   DRAINS:  none   TOTAL IV FLUIDS: per anesthesia      SPECIMENS: none   IMPLANTS:  - Arthrex 2.64mm PushLock x 1 - Arthrex 4.34mm SwiveLock x 1 - Iconix SPEED double loaded with 1.2 and 2.57mm tape x 1     OPERATIVE FINDINGS:  Examination under anesthesia: A careful examination under anesthesia was performed.  Passive range of motion was: FF: 150; ER at side: 45; ER in abduction: 90; IR in abduction: 45.  Anterior load shift: NT.  Posterior load shift: NT.  Sulcus in neutral: NT.  Sulcus in ER: NT.     Intra-operative findings: A thorough arthroscopic examination of the shoulder was performed.  The findings are: 1. Biceps tendon: tendinopathy with significant erythema  2. Superior labrum: erythema 3. Posterior labrum and capsule: normal 4. Inferior capsule and inferior recess: normal 5. Glenoid cartilage surface: Grade 1-2 degenerative changes focally about the inferior glenoid 6. Supraspinatus attachment: High-grade bursal sided partial-thickness tear of the anterior supraspinatus involving approximately 80% of the footprint 7. Posterior rotator cuff attachment: normal 8. Humeral head articular cartilage: normal 9. Rotator interval:  significant synovitis 10: Subscapularis tendon: attachment intact 11. Anterior labrum: degenerative 12. IGHL: normal   OPERATIVE REPORT:    Indications for procedure:  Nicholas Greene. is a 56 y.o. male with over 4 months of shoulder pain. He has had difficulty with overhead motion with pain and with sensations of weakness. Clinical exam and MRI were suggestive of rotator cuff tear, biceps tendinopathy, acromioclavicular joint arthritis, and subacromial impingement.  He has failed extensive nonoperative management.  After discussion of risks, benefits, and alternatives to surgery, the patient elected to proceed.    Procedure in detail:   I identified [name] in the pre-operative holding area.  I marked the operative shoulder with my initials. I reviewed the risks and benefits of the proposed surgical intervention, and the patient wished to proceed.  Anesthesia was then performed with an Exparel interscalene block.  The patient was transferred to the operative suite and placed in the beach chair position.     Appropriate IV antibiotics were administered prior to incision. The operative upper extremity was then prepped and draped in standard fashion. A time out was performed confirming the correct extremity, correct patient, and correct procedure.    I then created a standard posterior portal with an 11 blade. The glenohumeral joint was easily entered with a blunt trocar and the arthroscope introduced. The findings of diagnostic arthroscopy are described above. I debrided degenerative tissue including the synovitic tissue about the rotator interval and anterior and superior labrum.  I also debrided the cartilaginous surfaces of the inferior glenoid such that there were stable edges.  I then coagulated the inflamed synovium to obtain hemostasis and reduce the risk of post-operative swelling using  an Designer, jewellery.   I then turned my attention to the arthroscopic  biceps tenodesis. The Loop n Tack technique was used to pass a FiberTape through the biceps in a locked fashion adjacent to the biceps anchor.  A hole for a 2.9 mm Arthrex PushLock was drilled in the bicipital groove just superior to the subscapularis tendon insertion.  The biceps tendon was then cut and the biceps anchor complex was debrided down to a stable base on the superior labrum.  The FiberTape was loaded onto the PushLock anchor and impacted into place into the previously drilled hole in the bicipital groove.  This appropriately secured the biceps into the bicipital groove and took it off of tension.   Next, the arthroscope was then introduced into the subacromial space. A direct lateral portal was created with an 11-blade after spinal needle localization. An extensive subacromial bursectomy was performed using a combination of the shaver and Arthrocare wand. The entire acromial undersurface was exposed and the CA ligament was subperiosteally elevated to expose the prominent anterior acromial hook. A burr was used to create a flat anterior and lateral aspect of the acromion, converting it from a Type 3 to a Type 1 acromion. Care was made to keep the deltoid fascia intact.  I then turned my attention to the arthroscopic distal clavicle excision. I identified the acromioclavicular joint. Surrounding bursal tissue was debrided and the edges of the joint were identified. I used the 5.76mm barrel burr to remove the distal clavicle parallel to the edge of the acromion. I was able to fit two widths of the burr into the space between the distal clavicle and acromion, signifying that I had removed ~44mm of distal clavicle. This was confirmed by viewing anteriorly and introducing a probe with measuring marks from the lateral portal. Hemostasis was achieved with an Arthrocare wand.   Next I created an accessory posterolateral portal to assist with visualization and instrumentation.  I debrided the poor quality  edges of the supraspinatus tendon.  It was found to involve approximately 80% of the footprint.  Given the high-grade nature of this tear, the tear was completed, and the articular margin was visualized.  This was a U-shaped tear of the supraspinatus.  I prepared the footprint using a burr to expose bleeding bone.    I then percutaneously placed 1 Iconix SPEED medial row anchor at the articular margin. I then shuttled all 4 strands of tape through the rotator cuff just lateral to the musculotendinous junction using a FirstPass suture passer spanning the anterior to posterior extent of the tear. All 4 strands of suture were passed through an [employer] anchor.  This was placed approximately 1 cm distal to the lateral edge of the footprint in line with the midportion of the tear with appropriate tensioning of each suture prior to final fixation. This construct allowed for excellent reapproximation of the rotator cuff to its native footprint without undue tension.  Appropriate compression was achieved. The repair was stable to external and internal rotation.   Fluid was evacuated from the shoulder, and the portals were closed with 3-0 Nylon. Xeroform was applied to the portals. A sterile dressing was applied, followed by a Polar Care sleeve and a SlingShot shoulder immobilizer/sling. The patient was awakened from anesthesia without difficulty and was transferred to the PACU in stable condition.    Of note, assistance from a PA was essential to performing the surgery.  PA was present for the entire surgery.  PA  assisted with patient positioning, retraction, instrumentation, and wound closure. The surgery would have been more difficult and had longer operative time without PA assistance.   COMPLICATIONS: none   DISPOSITION: plan for discharge home after recovery in PACU     POSTOPERATIVE PLAN: Remain in sling (except hygiene and elbow/wrist/hand RoM exercises as instructed by PT) x 6 weeks and NWB for  this time. PT to begin 3-4 days after surgery.  Large rotator cuff repair rehab protocol. ASA 325mg  daily x 2 weeks for DVT ppx.

## 2022-06-24 NOTE — Transfer of Care (Signed)
Immediate Anesthesia Transfer of Care Note  Patient: Nicholas Greene.  Procedure(s) Performed: Left shoulder arthroscopic rotator cuff repair, subacromial decompression, and arthroscopic biceps tenodesis, distal clavical excision (Left: Shoulder)  Patient Location: PACU  Anesthesia Type: General, Regional  Level of Consciousness: awake, alert  and patient cooperative  Airway and Oxygen Therapy: Patient Spontanous Breathing and Patient connected to supplemental oxygen  Post-op Assessment: Post-op Vital signs reviewed, Patient's Cardiovascular Status Stable, Respiratory Function Stable, Patent Airway and No signs of Nausea or vomiting  Post-op Vital Signs: Reviewed and stable  Complications: No notable events documented.

## 2022-06-24 NOTE — Anesthesia Postprocedure Evaluation (Signed)
Anesthesia Post Note  Patient: Nicholas Greene.  Procedure(s) Performed: Left shoulder arthroscopic rotator cuff repair, subacromial decompression, and arthroscopic biceps tenodesis, distal clavical excision (Left: Shoulder)     Patient location during evaluation: PACU Anesthesia Type: Regional Level of consciousness: awake and alert Pain management: pain level controlled Vital Signs Assessment: post-procedure vital signs reviewed and stable Respiratory status: spontaneous breathing and nonlabored ventilation Cardiovascular status: blood pressure returned to baseline Postop Assessment: no apparent nausea or vomiting Anesthetic complications: no   No notable events documented.  Melva Faux [employer]

## 2022-06-24 NOTE — Progress Notes (Signed)
Assisted Nicholas Greene ANMD with left, interscalene , ultrasound guided block. Side rails up, monitors on throughout procedure. See vital signs in flow sheet. Tolerated Procedure well.

## 2022-06-27 ENCOUNTER — Encounter: Payer: Self-pay | Admitting: Orthopedic Surgery

## 2022-11-17 ENCOUNTER — Encounter: Payer: Self-pay | Admitting: Ophthalmology

## 2022-11-21 NOTE — Discharge Instructions (Signed)

## 2022-11-21 NOTE — Anesthesia Preprocedure Evaluation (Signed)
Anesthesia Evaluation  Patient identified by MRN, date of birth, ID band Patient awake    Reviewed: Allergy & Precautions, NPO status , Patient's Chart, lab work & pertinent test results  History of Anesthesia Complications Negative for: history of anesthetic complications  Airway Mallampati: IV   Neck ROM: Full    Dental  (+) Missing   Pulmonary sleep apnea and Continuous Positive Airway Pressure Ventilation , Current Smoker (1/2 ppd)Patient did not abstain from smoking.   Pulmonary exam normal breath sounds clear to auscultation       Cardiovascular hypertension, Normal cardiovascular exam Rhythm:Regular Rate:Normal     Neuro/Psych  PSYCHIATRIC DISORDERS Anxiety     Chronic shoulder pain; marijuana use    GI/Hepatic negative GI ROS,,,  Endo/Other  Obesity   Renal/GU Renal disease (nephrolithiasis)     Musculoskeletal  (+) Arthritis ,    Abdominal   Peds  Hematology negative hematology ROS (+)   Anesthesia Other Findings   Reproductive/Obstetrics                             Anesthesia Physical Anesthesia Plan  ASA: 2  Anesthesia Plan: MAC   Post-op Pain Management:    Induction: Intravenous  PONV Risk Score and Plan: 0 and Treatment may vary due to age or medical condition, Midazolam and TIVA  Airway Management Planned: Natural Airway and Nasal Cannula  Additional Equipment:   Intra-op Plan:   Post-operative Plan:   Informed Consent: I have reviewed the patients History and Physical, chart, labs and discussed the procedure including the risks, benefits and alternatives for the proposed anesthesia with the patient or authorized representative who has indicated his/her understanding and acceptance.     Dental advisory given  Plan Discussed with: CRNA  Anesthesia Plan Comments: (LMA/GETA backup discussed.  Patient consented for risks of anesthesia including but not  limited to:  - adverse reactions to medications - damage to eyes, teeth, lips or other oral mucosa - nerve damage due to positioning  - sore throat or hoarseness - damage to heart, brain, nerves, lungs, other parts of body or loss of life  Informed patient about role of CRNA in peri- and intra-operative care.  Patient voiced understanding.)       Anesthesia Quick Evaluation

## 2022-11-23 ENCOUNTER — Ambulatory Visit: Payer: 59 | Admitting: General Practice

## 2022-11-23 ENCOUNTER — Ambulatory Visit
Admission: RE | Admit: 2022-11-23 | Discharge: 2022-11-23 | Disposition: A | Discharge status: Home / Self Care | Payer: 59 | Attending: Ophthalmology | Admitting: Ophthalmology

## 2022-11-23 ENCOUNTER — Encounter: Payer: Self-pay | Admitting: Ophthalmology

## 2022-11-23 ENCOUNTER — Other Ambulatory Visit: Payer: Self-pay

## 2022-11-23 ENCOUNTER — Encounter
Admission: RE | Disposition: A | Discharge status: Home / Self Care | Payer: Self-pay | Source: Home / Self Care | Attending: Ophthalmology

## 2022-11-23 DIAGNOSIS — H2512 Age-related nuclear cataract, left eye: Secondary | ICD-10-CM | POA: Diagnosis not present

## 2022-11-23 DIAGNOSIS — F17210 Nicotine dependence, cigarettes, uncomplicated: Secondary | ICD-10-CM | POA: Diagnosis not present

## 2022-11-23 DIAGNOSIS — I10 Essential (primary) hypertension: Secondary | ICD-10-CM | POA: Diagnosis not present

## 2022-11-23 DIAGNOSIS — G4730 Sleep apnea, unspecified: Secondary | ICD-10-CM | POA: Insufficient documentation

## 2022-11-23 DIAGNOSIS — F419 Anxiety disorder, unspecified: Secondary | ICD-10-CM | POA: Diagnosis not present

## 2022-11-23 HISTORY — PX: CATARACT EXTRACTION W/PHACO: SHX586

## 2022-11-23 SURGERY — PHACOEMULSIFICATION, CATARACT, WITH IOL INSERTION
Anesthesia: Monitor Anesthesia Care | Site: Eye | Laterality: Left

## 2022-11-23 MED ORDER — SIGHTPATH DOSE#1 BSS IO SOLN
INTRAOCULAR | Status: DC | PRN
  Administered 2022-11-23: 15 mL

## 2022-11-23 MED ORDER — ACETAMINOPHEN 160 MG/5ML PO SOLN: 325.0000 mg | ORAL | Status: DC | PRN

## 2022-11-23 MED ORDER — TETRACAINE 0.5 % OP SOLN OPTIME - NO CHARGE
OPHTHALMIC | Status: DC | PRN
  Administered 2022-11-23: 2 [drp] via OPHTHALMIC

## 2022-11-23 MED ORDER — SIGHTPATH DOSE#1 NA HYALUR & NA CHOND-NA HYALUR IO KIT
PACK | INTRAOCULAR | Status: DC | PRN
  Administered 2022-11-23: 1 via OPHTHALMIC

## 2022-11-23 MED ORDER — MIDAZOLAM HCL 2 MG/2ML IJ SOLN
INTRAMUSCULAR | Status: DC | PRN
  Administered 2022-11-23 (×2): 2 mg via INTRAVENOUS

## 2022-11-23 MED ORDER — BRIMONIDINE TARTRATE-TIMOLOL 0.2-0.5 % OP SOLN
OPHTHALMIC | Status: DC | PRN
  Administered 2022-11-23: 1 [drp] via OPHTHALMIC

## 2022-11-23 MED ORDER — TETRACAINE HCL 0.5 % OP SOLN
1.0000 [drp] | OPHTHALMIC | Status: DC | PRN
  Administered 2022-11-23 (×3): 1 [drp] via OPHTHALMIC

## 2022-11-23 MED ORDER — FENTANYL CITRATE (PF) 100 MCG/2ML IJ SOLN
INTRAMUSCULAR | Status: DC | PRN
  Administered 2022-11-23 (×4): 50 ug via INTRAVENOUS

## 2022-11-23 MED ORDER — ACETAMINOPHEN 325 MG PO TABS: 650.0000 mg | ORAL_TABLET | Freq: Once | ORAL | Status: DC | PRN

## 2022-11-23 MED ORDER — ARMC OPHTHALMIC DILATING DROPS
1.0000 | OPHTHALMIC | Status: DC | PRN
  Administered 2022-11-23 (×3): 1 via OPHTHALMIC

## 2022-11-23 MED ORDER — SIGHTPATH DOSE#1 BSS IO SOLN
INTRAOCULAR | Status: DC | PRN
  Administered 2022-11-23: 60 mL via OPHTHALMIC

## 2022-11-23 MED ORDER — SIGHTPATH DOSE#1 BSS IO SOLN
INTRAOCULAR | Status: DC | PRN
  Administered 2022-11-23: 1 mL via INTRAMUSCULAR

## 2022-11-23 MED ORDER — DEXMEDETOMIDINE HCL IN NACL 80 MCG/20ML IV SOLN
INTRAVENOUS | Status: DC | PRN
  Administered 2022-11-23 (×2): 12 ug via BUCCAL

## 2022-11-23 MED ORDER — LACTATED RINGERS IV SOLN: INTRAVENOUS | Status: DC

## 2022-11-23 MED ORDER — ONDANSETRON HCL 4 MG/2ML IJ SOLN: 4.0000 mg | Freq: Once | INTRAMUSCULAR | Status: DC | PRN

## 2022-11-23 MED ORDER — CEFUROXIME OPHTHALMIC INJECTION 1 MG/0.1 ML
INJECTION | OPHTHALMIC | Status: DC | PRN
  Administered 2022-11-23: .1 mL via INTRACAMERAL

## 2022-11-23 SURGICAL SUPPLY — 21 items
CANNULA ANT/CHMB 27G (MISCELLANEOUS) IMPLANT
CANNULA ANT/CHMB 27GA (MISCELLANEOUS) IMPLANT
CATARACT SUITE SIGHTPATH (MISCELLANEOUS) ×1 IMPLANT
DRSG TEGADERM 2-3/8X2-3/4 SM (GAUZE/BANDAGES/DRESSINGS) IMPLANT
FEE CATARACT SUITE SIGHTPATH (MISCELLANEOUS) ×1 IMPLANT
GLOVE SRG 8 PF TXTR STRL LF DI (GLOVE) ×1 IMPLANT
GLOVE SURG ENC TEXT LTX SZ7.5 (GLOVE) ×1 IMPLANT
GLOVE SURG GAMMEX PI TX LF 7.5 (GLOVE) IMPLANT
GLOVE SURG UNDER POLY LF SZ8 (GLOVE) ×1
LENS IOL TECNIS EYHANCE 23.0 (Intraocular Lens) IMPLANT
NDL FILTER BLUNT 18X1 1/2 (NEEDLE) ×1 IMPLANT
NDL RETROBULBAR .5 NSTRL (NEEDLE) IMPLANT
NEEDLE FILTER BLUNT 18X1 1/2 (NEEDLE) ×1 IMPLANT
PACK VIT ANT 23G (MISCELLANEOUS) IMPLANT
RING MALYGIN 7.0 (MISCELLANEOUS) IMPLANT
SUT ETHILON 10-0 CS-B-6CS-B-6 (SUTURE)
SUT VICRYL  9 0 (SUTURE)
SUT VICRYL 9 0 (SUTURE) IMPLANT
SUTURE EHLN 10-0 CS-B-6CS-B-6 (SUTURE) IMPLANT
SYR 3ML LL SCALE MARK (SYRINGE) ×1 IMPLANT
WATER STERILE IRR 250ML POUR (IV SOLUTION) ×1 IMPLANT

## 2022-11-23 NOTE — Op Note (Signed)
  OPERATIVE NOTE  Nicholas Greene [id number] 11/23/2022   PREOPERATIVE DIAGNOSIS:  Nuclear sclerotic cataract left eye. H25.12   POSTOPERATIVE DIAGNOSIS:    Nuclear sclerotic cataract left eye.     PROCEDURE:  Phacoemusification with posterior chamber intraocular lens placement of the left eye  Ultrasound time: Procedure(s) with comments: CATARACT EXTRACTION PHACO AND INTRAOCULAR LENS PLACEMENT (IOC) LEFT (Left) - 4.70 0:48.5  LENS:   Implant Name Type Inv. Item Serial No. Manufacturer Lot No. LRB No. Used Action  LENS IOL TECNIS EYHANCE 23.0 - [id number] Intraocular Lens LENS IOL TECNIS EYHANCE 23.0 [id number] [employer]  Left 1 Implanted      SURGEON:  Deirdre Evener, MD   ANESTHESIA:  Topical with tetracaine drops and 2% Xylocaine jelly, augmented with 1% preservative-free intracameral lidocaine.    COMPLICATIONS:  None.   DESCRIPTION OF PROCEDURE:  The patient was identified in the holding room and transported to the operating room and placed in the supine position under the operating microscope.  The left eye was identified as the operative eye and it was prepped and draped in the usual sterile ophthalmic fashion.   A 1 millimeter clear-corneal paracentesis was made at the 1:30 position.  0.5 ml of preservative-free 1% lidocaine was injected into the anterior chamber.  The anterior chamber was filled with Viscoat viscoelastic.  A 2.4 millimeter keratome was used to make a near-clear corneal incision at the 10:30 position.  .  A curvilinear capsulorrhexis was made with a cystotome and capsulorrhexis forceps.  Balanced salt solution was used to hydrodissect and hydrodelineate the nucleus.   Phacoemulsification was then used in stop and chop fashion to remove the lens nucleus and epinucleus.  The remaining cortex was then removed using the irrigation and aspiration handpiece. Provisc was then placed into the capsular bag to distend it for lens placement.  A lens was  then injected into the capsular bag.  The remaining viscoelastic was aspirated.   Wounds were hydrated with balanced salt solution.  The anterior chamber was inflated to a physiologic pressure with balanced salt solution.  No wound leaks were noted. Cefuroxime 0.1 ml of a 10mg /ml solution was injected into the anterior chamber for a dose of 1 mg of intracameral antibiotic at the completion of the case.   Timolol and Brimonidine drops were applied to the eye.  The patient was taken to the recovery room in stable condition without complications of anesthesia or surgery.  [name] 11/23/2022, 8:41 AM

## 2022-11-23 NOTE — Transfer of Care (Signed)
Immediate Anesthesia Transfer of Care Note  Patient: Nicholas Greene  Procedure(s) Performed: CATARACT EXTRACTION PHACO AND INTRAOCULAR LENS PLACEMENT (IOC) LEFT (Left: Eye)  Patient Location: PACU  Anesthesia Type: MAC  Level of Consciousness: awake, alert  and patient cooperative  Airway and Oxygen Therapy: Patient Spontanous Breathing and Patient connected to supplemental oxygen  Post-op Assessment: Post-op Vital signs reviewed, Patient's Cardiovascular Status Stable, Respiratory Function Stable, Patent Airway and No signs of Nausea or vomiting  Post-op Vital Signs: Reviewed and stable  Complications: There were no known notable events for this encounter.

## 2022-11-23 NOTE — Anesthesia Postprocedure Evaluation (Signed)
Anesthesia Post Note  Patient: Nicholas Greene.  Procedure(s) Performed: CATARACT EXTRACTION PHACO AND INTRAOCULAR LENS PLACEMENT (IOC) LEFT (Left: Eye)  Patient location during evaluation: PACU Anesthesia Type: MAC Level of consciousness: awake and alert, oriented and patient cooperative Pain management: pain level controlled Vital Signs Assessment: post-procedure vital signs reviewed and stable Respiratory status: spontaneous breathing, nonlabored ventilation and respiratory function stable Cardiovascular status: blood pressure returned to baseline and stable Postop Assessment: adequate PO intake Anesthetic complications: no   There were no known notable events for this encounter.   Last Vitals:  Vitals:   11/23/22 0842 11/23/22 0847  BP:  115/83  Pulse: 73 70  Resp: 14 14  Temp: (!) 36.4 C (!) 36.4 C  SpO2: 96% 95%    Last Pain:  Vitals:   11/23/22 0847  TempSrc:   PainSc: 0-No pain                 Darrin Nipper

## 2022-11-23 NOTE — H&P (Signed)
[facility]   Primary Care Physician:  Jerl Mina, MD Ophthalmologist: Dr. Lockie Mola  Pre-Procedure History & Physical: HPI:  Nicholas Greene. is a 56 y.o. male here for ophthalmic surgery.   Past Medical History:  Diagnosis Date   Anxiety    Arthritis    Chronic kidney disease    kidney stones since age 80   GERD (gastroesophageal reflux disease)    with tomatoe products   Hypercholesteremia    Hypertension    Sleep apnea    cpap bring machine tested 10-12 yrs ago    Past Surgical History:  Procedure Laterality Date   BACK SURGERY     Jan 17 2014   CYSTOSCOPY  2007   CYSTOSCOPY/URETEROSCOPY/HOLMIUM LASER/STENT PLACEMENT Left 08/14/2017   Procedure: CYSTOSCOPY/URETEROSCOPY/HOLMIUM LASER/STENT PLACEMENT;  Surgeon: Vanna Scotland, MD;  Location: [facility] ORS;  Service: Urology;  Laterality: Left;   MAXIMUM ACCESS (MAS)POSTERIOR LUMBAR INTERBODY FUSION (PLIF) 1 LEVEL N/A 08/11/2014   Procedure: FOR MAXIMUM ACCESS (MAS) POSTERIOR LUMBAR INTERBODY FUSION (PLIF) LUMBAR FIVE-SACRAL ONE;  Surgeon: Temple Pacini, MD;  Location: [facility];  Service: Neurosurgery;  Laterality: N/A;  FOR MAXIMUM ACCESS (MAS) POSTERIOR LUMBAR INTERBODY FUSION (PLIF) LUMBAR FIVE-SACRAL ONE   SHOULDER ARTHROSCOPY Left 06/24/2022   Procedure: Left shoulder arthroscopic rotator cuff repair, subacromial decompression, and arthroscopic biceps tenodesis, distal clavical excision;  Surgeon: Signa Kell, MD;  Location: [facility];  Service: Orthopedics;  Laterality: Left;    Prior to Admission medications   Medication Sig Start Date End Date Taking? Authorizing Provider  atorvastatin (LIPITOR) 20 MG tablet Take 20 mg by mouth daily.   Yes [provider]  cyclobenzaprine (FLEXERIL) 10 MG tablet Take 10 mg by mouth 2 (two) times daily as needed for muscle spasms.    Yes [provider]  diazepam (VALIUM) 5 MG tablet Take 5 mg by mouth every 6 (six) hours as needed  for anxiety.   Yes [provider]  loratadine-pseudoephedrine (CLARITIN-D 24-HOUR) 10-240 MG 24 hr tablet Take 1 tablet by mouth daily.   Yes [provider]  oxyCODONE (ROXICODONE) 5 MG immediate release tablet Take 1-2 tablets (5-10 mg total) by mouth every 4 (four) hours as needed (pain). 06/24/22 06/24/23 Yes Signa Kell, MD  oxyCODONE-acetaminophen (PERCOCET) 10-325 MG tablet Take 1 tablet by mouth every 8 (eight) hours as needed for pain.    Yes [provider]  rosuvastatin (CRESTOR) 10 MG tablet Take 10 mg PO three times a week at bedtime 11/11/19  Yes Danelle Berry, PA-C  acetaminophen (TYLENOL) 500 MG tablet Take 2 tablets (1,000 mg total) by mouth every 8 (eight) hours. Patient not taking: Reported on 11/17/2022 06/24/22 06/24/23  Signa Kell, MD  ondansetron (ZOFRAN-ODT) 4 MG disintegrating tablet Take 1 tablet (4 mg total) by mouth every 8 (eight) hours as needed for nausea or vomiting. Patient not taking: Reported on 11/17/2022 06/24/22   Signa Kell, MD    Allergies as of 11/02/2022 - Review Complete 06/24/2022  Allergen Reaction Noted   Cortisone acetate [cortisone] Other (See Comments) 10/31/2012    Family History  Problem Relation Age of Onset   Hematuria Mother    Lung cancer Mother    Prostate cancer Father    Kidney cancer Neg Hx    Bladder Cancer Neg Hx     Social History   Socioeconomic History   Marital status: Married    Spouse name: Rinaldo Cloud   Number of children: Not on file  Years of education: 59   Highest education level: GED or equivalent  Occupational History   Occupation: retired  Tobacco Use   Smoking status: Every Day    Packs/day: 0.50    Years: 30.00    Total pack years: 15.00    Types: Cigarettes   Smokeless tobacco: Never  Vaping Use   Vaping Use: Never used  Substance and Sexual Activity   Alcohol use: Yes    Comment: occassionally but none since January   Drug use: Yes    Types: Marijuana   Sexual activity:  Yes    Partners: Female  Other Topics Concern   Not on file  Social History Narrative   Not on file   Social Determinants of Health   Financial Resource Strain: Low Risk  (10/30/2018)   Overall Financial Resource Strain (CARDIA)    Difficulty of Paying Living Expenses: Not hard at all  Recent Concern: Financial Resource Strain - High Risk (09/26/2018)   Overall Financial Resource Strain (CARDIA)    Difficulty of Paying Living Expenses: Hard  Food Insecurity: No Food Insecurity (09/26/2018)   Hunger Vital Sign    Worried About Running Out of Food in the Last Year: Never true    Ran Out of Food in the Last Year: Never true  Transportation Needs: No Transportation Needs (09/26/2018)   PRAPARE - Administrator, Civil Service (Medical): No    Lack of Transportation (Non-Medical): No  Physical Activity: Insufficiently Active (10/30/2018)   Exercise Vital Sign    Days of Exercise per Week: 1 day    Minutes of Exercise per Session: 30 min  Stress: Stress Concern Present (09/26/2018)   [employer] of Occupational Health - Occupational Stress Questionnaire    Feeling of Stress : Rather much  Social Connections: Moderately Integrated (09/26/2018)   Social Connection and Isolation Panel [NHANES]    Frequency of Communication with Friends and Family: More than three times a week    Frequency of Social Gatherings with Friends and Family: More than three times a week    Attends Religious Services: More than 4 times per year    Active Member of [employer] or Organizations: No    Attends Banker Meetings: Never    Marital Status: Married  Catering manager Violence: Not At Risk (09/26/2018)   Humiliation, Afraid, Rape, and Kick questionnaire    Fear of Current or Ex-Partner: No    Emotionally Abused: No    Physically Abused: No    Sexually Abused: No    Review of Systems: See HPI, otherwise negative ROS  Physical Exam: BP (!) 126/90   Pulse 65   Temp 98.2 F (36.8  C) (Temporal)   Resp 20   Ht 5\' 6"  (1.676 m)   Wt 96.2 kg   SpO2 96%   BMI 34.22 kg/m  General:   Alert,  pleasant and cooperative in NAD Head:  Normocephalic and atraumatic. Lungs:  Clear to auscultation.    Heart:  Regular rate and rhythm.   Impression/Plan: . is here for ophthalmic surgery.  Risks, benefits, limitations, and alternatives regarding ophthalmic surgery have been reviewed with the patient.  Questions have been answered.  All parties agreeable.   Leodis Sias, MD  11/23/2022, 7:57 AM

## 2022-11-24 ENCOUNTER — Encounter: Payer: Self-pay | Admitting: Ophthalmology

## 2023-03-23 ENCOUNTER — Other Ambulatory Visit: Payer: Self-pay | Admitting: Orthopedic Surgery

## 2023-03-23 DIAGNOSIS — S8392XA Sprain of unspecified site of left knee, initial encounter: Secondary | ICD-10-CM

## 2023-03-24 ENCOUNTER — Encounter: Payer: Self-pay | Admitting: Orthopedic Surgery

## 2023-03-28 ENCOUNTER — Encounter: Payer: Self-pay | Admitting: Orthopedic Surgery

## 2023-03-31 ENCOUNTER — Ambulatory Visit
Admission: RE | Admit: 2023-03-31 | Discharge: 2023-03-31 | Disposition: A | Discharge status: Home / Self Care | Payer: 59 | Source: Ambulatory Visit | Attending: Orthopedic Surgery | Admitting: Orthopedic Surgery

## 2023-03-31 DIAGNOSIS — S8392XA Sprain of unspecified site of left knee, initial encounter: Secondary | ICD-10-CM

## 2023-04-17 ENCOUNTER — Telehealth: Payer: Self-pay

## 2023-04-17 ENCOUNTER — Other Ambulatory Visit: Payer: Self-pay

## 2023-04-17 DIAGNOSIS — Z1211 Encounter for screening for malignant neoplasm of colon: Secondary | ICD-10-CM

## 2023-04-17 DIAGNOSIS — Z800 Family history of malignant neoplasm of digestive organs: Secondary | ICD-10-CM

## 2023-04-17 MED ORDER — NA SULFATE-K SULFATE-MG SULF 17.5-3.13-1.6 GM/177ML PO SOLN: 1.0000 | Freq: Once | ORAL | 0 refills | Status: AC

## 2023-04-17 NOTE — Telephone Encounter (Signed)
Gastroenterology Pre-Procedure Review  Request Date: 05/02/23 Requesting Physician: Dr. Servando Snare  PATIENT REVIEW QUESTIONS: The patient responded to the following health history questions as indicated:    1. Are you having any GI issues? no 2. Do you have a personal history of Polyps? no 3. Do you have a family history of Colon Cancer or Polyps? yes (Father colon cancer) 4. Diabetes Mellitus? no 5. Joint replacements in the past 12 months?yes (Rotator cuff  (Left Shoulder) 12/28/22) 6. Major health problems in the past 3 months?no 7. Any artificial heart valves, MVP, or defibrillator?no    MEDICATIONS & ALLERGIES:    Patient reports the following regarding taking any anticoagulation/antiplatelet therapy:   Plavix, Coumadin, Eliquis, Xarelto, Lovenox, Pradaxa, Brilinta, or Effient? no Aspirin? no  Patient confirms/reports the following medications:  Current Outpatient Medications  Medication Sig Dispense Refill   acetaminophen (TYLENOL) 500 MG tablet Take 2 tablets (1,000 mg total) by mouth every 8 (eight) hours. (Patient not taking: Reported on 11/17/2022) 90 tablet 2   atorvastatin (LIPITOR) 20 MG tablet Take 20 mg by mouth daily.     cyclobenzaprine (FLEXERIL) 10 MG tablet Take 10 mg by mouth 2 (two) times daily as needed for muscle spasms.      diazepam (VALIUM) 5 MG tablet Take 5 mg by mouth every 6 (six) hours as needed for anxiety.     loratadine-pseudoephedrine (CLARITIN-D 24-HOUR) 10-240 MG 24 hr tablet Take 1 tablet by mouth daily.     ondansetron (ZOFRAN-ODT) 4 MG disintegrating tablet Take 1 tablet (4 mg total) by mouth every 8 (eight) hours as needed for nausea or vomiting. (Patient not taking: Reported on 11/17/2022) 20 tablet 0   oxyCODONE (ROXICODONE) 5 MG immediate release tablet Take 1-2 tablets (5-10 mg total) by mouth every 4 (four) hours as needed (pain). 30 tablet 0   oxyCODONE-acetaminophen (PERCOCET) 10-325 MG tablet Take 1 tablet by mouth every 8 (eight) hours as  needed for pain.      rosuvastatin (CRESTOR) 10 MG tablet Take 10 mg PO three times a week at bedtime 90 tablet 3   No current facility-administered medications for this visit.    Patient confirms/reports the following allergies:  Allergies  Allergen Reactions   Cortisone Acetate [Cortisone] Other (See Comments)    Shots caused redness and swelling at injection sites    No orders of the defined types were placed in this encounter.   AUTHORIZATION INFORMATION Primary Insurance: 1D#: Group #:  Secondary Insurance: 1D#: Group #:  SCHEDULE INFORMATION: Date: 05/02/23 Time: Location: [facility]

## 2023-05-02 ENCOUNTER — Ambulatory Visit: Payer: 59 | Admitting: Anesthesiology

## 2023-05-02 ENCOUNTER — Ambulatory Visit
Admission: RE | Admit: 2023-05-02 | Discharge: 2023-05-02 | Disposition: A | Discharge status: Home / Self Care | Payer: 59 | Attending: Gastroenterology | Admitting: Gastroenterology

## 2023-05-02 ENCOUNTER — Encounter: Payer: Self-pay | Admitting: Gastroenterology

## 2023-05-02 ENCOUNTER — Encounter
Admission: RE | Disposition: A | Discharge status: Home / Self Care | Payer: Self-pay | Source: Home / Self Care | Attending: Gastroenterology

## 2023-05-02 ENCOUNTER — Other Ambulatory Visit: Payer: Self-pay

## 2023-05-02 DIAGNOSIS — D127 Benign neoplasm of rectosigmoid junction: Secondary | ICD-10-CM | POA: Insufficient documentation

## 2023-05-02 DIAGNOSIS — F17210 Nicotine dependence, cigarettes, uncomplicated: Secondary | ICD-10-CM | POA: Insufficient documentation

## 2023-05-02 DIAGNOSIS — G4730 Sleep apnea, unspecified: Secondary | ICD-10-CM | POA: Insufficient documentation

## 2023-05-02 DIAGNOSIS — D124 Benign neoplasm of descending colon: Secondary | ICD-10-CM | POA: Insufficient documentation

## 2023-05-02 DIAGNOSIS — I10 Essential (primary) hypertension: Secondary | ICD-10-CM | POA: Diagnosis not present

## 2023-05-02 DIAGNOSIS — K635 Polyp of colon: Secondary | ICD-10-CM | POA: Insufficient documentation

## 2023-05-02 DIAGNOSIS — D123 Benign neoplasm of transverse colon: Secondary | ICD-10-CM | POA: Insufficient documentation

## 2023-05-02 DIAGNOSIS — Z1211 Encounter for screening for malignant neoplasm of colon: Secondary | ICD-10-CM | POA: Insufficient documentation

## 2023-05-02 DIAGNOSIS — Z800 Family history of malignant neoplasm of digestive organs: Secondary | ICD-10-CM

## 2023-05-02 HISTORY — PX: COLONOSCOPY WITH PROPOFOL: SHX5780

## 2023-05-02 SURGERY — COLONOSCOPY WITH PROPOFOL
Anesthesia: Monitor Anesthesia Care

## 2023-05-02 MED ORDER — LIDOCAINE HCL (CARDIAC) PF 100 MG/5ML IV SOSY
PREFILLED_SYRINGE | INTRAVENOUS | Status: DC | PRN
  Administered 2023-05-02: 50 mg via INTRAVENOUS

## 2023-05-02 MED ORDER — PROPOFOL 500 MG/50ML IV EMUL
INTRAVENOUS | Status: DC | PRN
  Administered 2023-05-02: 140 ug/kg/min via INTRAVENOUS

## 2023-05-02 MED ORDER — SODIUM CHLORIDE 0.9 % IV SOLN: INTRAVENOUS | Status: DC

## 2023-05-02 MED ORDER — PROPOFOL 10 MG/ML IV BOLUS
INTRAVENOUS | Status: DC | PRN
  Administered 2023-05-02: 100 mg via INTRAVENOUS

## 2023-05-02 NOTE — Anesthesia Postprocedure Evaluation (Signed)
Anesthesia Post Note  Patient: Nicholas Greene.  Procedure(s) Performed: COLONOSCOPY WITH PROPOFOL  Patient location during evaluation: Endoscopy Anesthesia Type: MAC Level of consciousness: awake and alert Pain management: pain level controlled Vital Signs Assessment: post-procedure vital signs reviewed and stable Respiratory status: spontaneous breathing, nonlabored ventilation, respiratory function stable and patient connected to nasal cannula oxygen Cardiovascular status: blood pressure returned to baseline and stable Postop Assessment: no apparent nausea or vomiting Anesthetic complications: no   No notable events documented.   Last Vitals:  Vitals:   05/02/23 1034 05/02/23 1037  BP:  (!) 123/91  Pulse:    Resp:    Temp: (!) 35.6 C   SpO2:      Last Pain:  Vitals:   05/02/23 1037  TempSrc:   PainSc: 0-No pain                 Lenard Simmer

## 2023-05-02 NOTE — H&P (Signed)
Midge Minium, MD [facility] [address] [place], [place] [address] Phone: [phone] Fax : [phone]  Primary Care Physician:  Jerl Mina, MD Primary Gastroenterologist:  Dr. Servando Snare  Pre-Procedure History & Physical: HPI:  Nicholas Greene. is a 57 y.o. male is here for a screening colonoscopy.   Past Medical History:  Diagnosis Date   Anxiety    Arthritis    Chronic kidney disease    kidney stones since age 32   GERD (gastroesophageal reflux disease)    with tomatoe products   Hypercholesteremia    Hypertension    Sleep apnea    cpap bring machine tested 10-12 yrs ago    Past Surgical History:  Procedure Laterality Date   BACK SURGERY     Jan 17 2014   CATARACT EXTRACTION W/PHACO Left 11/23/2022   Procedure: CATARACT EXTRACTION PHACO AND INTRAOCULAR LENS PLACEMENT (IOC) LEFT;  Surgeon: Lockie Mola, MD;  Location: [facility];  Service: Ophthalmology;  Laterality: Left;  4.70 0:48.5   CYSTOSCOPY  2007   CYSTOSCOPY/URETEROSCOPY/HOLMIUM LASER/STENT PLACEMENT Left 08/14/2017   Procedure: CYSTOSCOPY/URETEROSCOPY/HOLMIUM LASER/STENT PLACEMENT;  Surgeon: Vanna Scotland, MD;  Location: [facility];  Service: Urology;  Laterality: Left;   MAXIMUM ACCESS (MAS)POSTERIOR LUMBAR INTERBODY FUSION (PLIF) 1 LEVEL N/A 08/11/2014   Procedure: FOR MAXIMUM ACCESS (MAS) POSTERIOR LUMBAR INTERBODY FUSION (PLIF) LUMBAR FIVE-SACRAL ONE;  Surgeon: Temple Pacini, MD;  Location: [facility];  Service: Neurosurgery;  Laterality: N/A;  FOR MAXIMUM ACCESS (MAS) POSTERIOR LUMBAR INTERBODY FUSION (PLIF) LUMBAR FIVE-SACRAL ONE   SHOULDER ARTHROSCOPY Left 06/24/2022   Procedure: Left shoulder arthroscopic rotator cuff repair, subacromial decompression, and arthroscopic biceps tenodesis, distal clavical excision;  Surgeon: Signa Kell, MD;  Location: [facility];  Service: Orthopedics;  Laterality: Left;    Prior to Admission medications   Medication Sig Start Date End  Date Taking? Authorizing Provider  atorvastatin (LIPITOR) 20 MG tablet Take 20 mg by mouth daily.   Yes [provider]  loratadine-pseudoephedrine (CLARITIN-D 24-HOUR) 10-240 MG 24 hr tablet Take 1 tablet by mouth daily.   Yes [provider]  acetaminophen (TYLENOL) 500 MG tablet Take 2 tablets (1,000 mg total) by mouth every 8 (eight) hours. Patient not taking: Reported on 11/17/2022 06/24/22 06/24/23  Signa Kell, MD  cyclobenzaprine (FLEXERIL) 10 MG tablet Take 10 mg by mouth 2 (two) times daily as needed for muscle spasms.  Patient not taking: Reported on 04/25/2023    [provider]  diazepam (VALIUM) 5 MG tablet Take 5 mg by mouth every 6 (six) hours as needed for anxiety. Patient not taking: Reported on 04/25/2023    [provider]  ondansetron (ZOFRAN-ODT) 4 MG disintegrating tablet Take 1 tablet (4 mg total) by mouth every 8 (eight) hours as needed for nausea or vomiting. Patient not taking: Reported on 11/17/2022 06/24/22   Signa Kell, MD  oxyCODONE (ROXICODONE) 5 MG immediate release tablet Take 1-2 tablets (5-10 mg total) by mouth every 4 (four) hours as needed (pain). Patient not taking: Reported on 04/25/2023 06/24/22 06/24/23  Signa Kell, MD  oxyCODONE-acetaminophen (PERCOCET) 10-325 MG tablet Take 1 tablet by mouth every 8 (eight) hours as needed for pain.     [provider]  rosuvastatin (CRESTOR) 10 MG tablet Take 10 mg PO three times a week at bedtime Patient not taking: Reported on 05/02/2023 11/11/19   Danelle Berry, PA-C    Allergies as of 04/17/2023 - Review Complete 11/23/2022  Allergen Reaction Noted  Cortisone acetate [cortisone] Other (See Comments) 10/31/2012    Family History  Problem Relation Age of Onset   Hematuria Mother    Lung cancer Mother    Prostate cancer Father    Kidney cancer Neg Hx    Bladder Cancer Neg Hx     Social History   Socioeconomic History   Marital status: Married    Spouse name: Rinaldo Cloud    Number of children: Not on file   Years of education: 12   Highest education level: GED or equivalent  Occupational History   Occupation: retired  Tobacco Use   Smoking status: Every Day    Packs/day: 0.50    Years: 30.00    Additional pack years: 0.00    Total pack years: 15.00    Types: Cigarettes   Smokeless tobacco: Never  Vaping Use   Vaping Use: Never used  Substance and Sexual Activity   Alcohol use: Yes    Comment: occassionally but none since January, quite several years ago   Drug use: Yes    Types: Marijuana   Sexual activity: Yes    Partners: Female  Other Topics Concern   Not on file  Social History Narrative   Not on file   Social Determinants of Health   Financial Resource Strain: Low Risk  (10/30/2018)   Overall Financial Resource Strain (CARDIA)    Difficulty of Paying Living Expenses: Not hard at all  Recent Concern: Financial Resource Strain - High Risk (09/26/2018)   Overall Financial Resource Strain (CARDIA)    Difficulty of Paying Living Expenses: Hard  Food Insecurity: No Food Insecurity (09/26/2018)   Hunger Vital Sign    Worried About Running Out of Food in the Last Year: Never true    Ran Out of Food in the Last Year: Never true  Transportation Needs: No Transportation Needs (09/26/2018)   PRAPARE - Administrator, Civil Service (Medical): No    Lack of Transportation (Non-Medical): No  Physical Activity: Insufficiently Active (10/30/2018)   Exercise Vital Sign    Days of Exercise per Week: 1 day    Minutes of Exercise per Session: 30 min  Stress: Stress Concern Present (09/26/2018)   [employer] of Occupational Health - Occupational Stress Questionnaire    Feeling of Stress : Rather much  Social Connections: Moderately Integrated (09/26/2018)   Social Connection and Isolation Panel [NHANES]    Frequency of Communication with Friends and Family: More than three times a week    Frequency of Social Gatherings with Friends and  Family: More than three times a week    Attends Religious Services: More than 4 times per year    Active Member of [employer] or Organizations: No    Attends Banker Meetings: Never    Marital Status: Married  Catering manager Violence: Not At Risk (09/26/2018)   Humiliation, Afraid, Rape, and Kick questionnaire    Fear of Current or Ex-Partner: No    Emotionally Abused: No    Physically Abused: No    Sexually Abused: No    Review of Systems: See HPI, otherwise negative ROS  Physical Exam: BP 114/89   Pulse 71   Temp (!) 96.8 F (36 C) (Temporal)   Resp 20   Ht 5\' 6"  (1.676 m)   Wt 93.9 kg   SpO2 98%   BMI 33.41 kg/m  General:   Alert,  pleasant and cooperative in NAD Head:  Normocephalic and atraumatic. Neck:  Supple; no masses  or thyromegaly. Lungs:  Clear throughout to auscultation.    Heart:  Regular rate and rhythm. Abdomen:  Soft, nontender and nondistended. Normal bowel sounds, without guarding, and without rebound.   Neurologic:  Alert and  oriented x4;  grossly normal neurologically.  Impression/Plan: Nicholas Sias. is now here to undergo a screening colonoscopy.  Risks, benefits, and alternatives regarding colonoscopy have been reviewed with the patient.  Questions have been answered.  All parties agreeable.

## 2023-05-02 NOTE — Transfer of Care (Signed)
Immediate Anesthesia Transfer of Care Note  Patient: Nicholas Greene  Procedure(s) Performed: COLONOSCOPY WITH PROPOFOL  Patient Location: PACU and Endoscopy Unit  Anesthesia Type:General  Level of Consciousness: drowsy and patient cooperative  Airway & Oxygen Therapy: Patient Spontanous Breathing  Post-op Assessment: Report given to RN and Post -op Vital signs reviewed and stable  Post vital signs: Reviewed and stable  Last Vitals:  Vitals Value Taken Time  BP 103/76 05/02/23 1017  Temp    Pulse 72 05/02/23 1019  Resp 24 05/02/23 1019  SpO2 99 % 05/02/23 1019  Vitals shown include unvalidated device data.  Last Pain:  Vitals:   05/02/23 1017  TempSrc:   PainSc: 0-No pain         Complications: No notable events documented.

## 2023-05-02 NOTE — Op Note (Signed)
[facility] Gastroenterology Patient Name: Nicholas Greene Procedure Date: 05/02/2023 9:51 AM MRN: [id number] Account #: [id number] Date of Birth: 27-Oct-1966 Admit Type: Outpatient Age: 57 Room: [facility] ENDO ROOM 4 Gender: Male Note Status: Finalized Instrument Name: Prentice Docker [id number] Procedure:             Colonoscopy Indications:           Screening for colorectal malignant neoplasm Providers:             Midge Minium MD, MD Referring MD:          Midge Minium MD, MD (Referring MD), Rhona Leavens. Burnett Sheng,                         MD (Referring MD) Medicines:             Propofol per Anesthesia Complications:         No immediate complications. Procedure:             Pre-Anesthesia Assessment:                        - Prior to the procedure, a History and Physical was                         performed, and patient medications and allergies were                         reviewed. The patient's tolerance of previous                         anesthesia was also reviewed. The risks and benefits                         of the procedure and the sedation options and risks                         were discussed with the patient. All questions were                         answered, and informed consent was obtained. Prior                         Anticoagulants: The patient has taken no anticoagulant                         or antiplatelet agents. ASA Grade Assessment: II - A                         patient with mild systemic disease. After reviewing                         the risks and benefits, the patient was deemed in                         satisfactory condition to undergo the procedure.                        After obtaining informed consent, the colonoscope was  passed under direct vision. Throughout the procedure,                         the patient's blood pressure, pulse, and oxygen                         saturations were monitored continuously.  The                         Colonoscope was introduced through the anus and                         advanced to the the cecum, identified by appendiceal                         orifice and ileocecal valve. The colonoscopy was                         performed without difficulty. The patient tolerated                         the procedure well. The quality of the bowel                         preparation was excellent. Findings:      The perianal and digital rectal examinations were normal.      A 4 mm polyp was found in the transverse colon. The polyp was sessile.       The polyp was removed with a cold snare. Resection and retrieval were       complete.      A 5 mm polyp was found in the descending colon. The polyp was sessile.       The polyp was removed with a cold snare. Resection and retrieval were       complete.      Three sessile polyps were found in the recto-sigmoid colon. The polyps       were 3 to 6 mm in size. These polyps were removed with a cold snare.       Resection and retrieval were complete. Impression:            - One 4 mm polyp in the transverse colon, removed with                         a cold snare. Resected and retrieved.                        - One 5 mm polyp in the descending colon, removed with                         a cold snare. Resected and retrieved.                        - Three 3 to 6 mm polyps at the recto-sigmoid colon,                         removed with a cold snare. Resected and retrieved. Recommendation:        - Discharge patient to home.                        -  Resume previous diet.                        - Continue present medications.                        - Await pathology results.                        - Repeat colonoscopy in 3 years for surveillance. Procedure Code(s):     --- Professional ---                        [phone], Colonoscopy, flexible; with removal of                         tumor(s), polyp(s), or other lesion(s) by  snare                         technique Diagnosis Code(s):     --- Professional ---                        Z12.11, Encounter for screening for malignant neoplasm                         of colon                        D12.3, Benign neoplasm of transverse colon (hepatic                         flexure or splenic flexure) CPT copyright 2022 American Medical Association. All rights reserved. The codes documented in this report are preliminary and upon coder review may  be revised to meet current compliance requirements. Midge Minium MD, MD 05/02/2023 10:11:36 AM This report has been signed electronically. Number of Addenda: 0 Note Initiated On: 05/02/2023 9:51 AM Scope Withdrawal Time: 0 hours 10 minutes 19 seconds  Total Procedure Duration: 0 hours 12 minutes 32 seconds  Estimated Blood Loss:  Estimated blood loss: none.      [facility]

## 2023-05-02 NOTE — Anesthesia Preprocedure Evaluation (Signed)
Anesthesia Evaluation  Patient identified by MRN, date of birth, ID band Patient awake    Reviewed: Allergy & Precautions, NPO status , Patient's Chart, lab work & pertinent test results  History of Anesthesia Complications Negative for: history of anesthetic complications  Airway Mallampati: IV   Neck ROM: Full    Dental  (+) Missing, Dental Advidsory Given   Pulmonary neg shortness of breath, sleep apnea and Continuous Positive Airway Pressure Ventilation , neg COPD, neg recent URI, Current Smoker and Patient abstained from smoking.   Pulmonary exam normal breath sounds clear to auscultation       Cardiovascular Exercise Tolerance: Good hypertension, (-) angina (-) Past MI and (-) Cardiac Stents Normal cardiovascular exam(-) dysrhythmias (-) Valvular Problems/Murmurs Rhythm:Regular Rate:Normal     Neuro/Psych  PSYCHIATRIC DISORDERS Anxiety     Chronic shoulder pain; marijuana use negative neurological ROS     GI/Hepatic ,GERD  ,,  Endo/Other  neg diabetes  Obesity   Renal/GU Renal disease (nephrolithiasis)     Musculoskeletal  (+) Arthritis ,    Abdominal   Peds  Hematology negative hematology ROS (+)   Anesthesia Other Findings   Reproductive/Obstetrics                             Anesthesia Physical Anesthesia Plan  ASA: 2  Anesthesia Plan: MAC   Post-op Pain Management:    Induction: Intravenous  PONV Risk Score and Plan: 0 and Treatment may vary due to age or medical condition, TIVA and Propofol infusion  Airway Management Planned: Natural Airway and Nasal CPAP  Additional Equipment:   Intra-op Plan:   Post-operative Plan:   Informed Consent: I have reviewed the patients History and Physical, chart, labs and discussed the procedure including the risks, benefits and alternatives for the proposed anesthesia with the patient or authorized representative who has indicated  his/her understanding and acceptance.     Dental advisory given  Plan Discussed with: CRNA  Anesthesia Plan Comments:        Anesthesia Quick Evaluation

## 2023-05-03 ENCOUNTER — Encounter: Payer: Self-pay | Admitting: Gastroenterology

## 2023-05-03 LAB — SURGICAL PATHOLOGY

## 2023-05-04 ENCOUNTER — Encounter: Payer: Self-pay | Admitting: Gastroenterology

## 2023-10-18 ENCOUNTER — Emergency Department: Payer: PRIVATE HEALTH INSURANCE

## 2023-10-18 ENCOUNTER — Other Ambulatory Visit: Payer: Self-pay

## 2023-10-18 DIAGNOSIS — K8020 Calculus of gallbladder without cholecystitis without obstruction: Secondary | ICD-10-CM | POA: Diagnosis not present

## 2023-10-18 DIAGNOSIS — I129 Hypertensive chronic kidney disease with stage 1 through stage 4 chronic kidney disease, or unspecified chronic kidney disease: Secondary | ICD-10-CM | POA: Diagnosis not present

## 2023-10-18 DIAGNOSIS — R1011 Right upper quadrant pain: Secondary | ICD-10-CM | POA: Diagnosis present

## 2023-10-18 DIAGNOSIS — N200 Calculus of kidney: Secondary | ICD-10-CM | POA: Insufficient documentation

## 2023-10-18 DIAGNOSIS — N189 Chronic kidney disease, unspecified: Secondary | ICD-10-CM | POA: Diagnosis not present

## 2023-10-18 LAB — URINALYSIS, ROUTINE W REFLEX MICROSCOPIC
Bilirubin Urine: NEGATIVE
Glucose, UA: NEGATIVE mg/dL
Ketones, ur: NEGATIVE mg/dL
Leukocytes,Ua: NEGATIVE
Nitrite: NEGATIVE
Protein, ur: 30 mg/dL — AB
RBC / HPF: >50 RBC/hpf (ref 0–5)
Specific Gravity, Urine: 1.021 (ref 1.005–1.030)
Squamous Epithelial / HPF: 0 /HPF (ref 0–5)
pH: 5 (ref 5.0–8.0)

## 2023-10-18 LAB — LIPASE, BLOOD: Lipase: 42 U/L (ref 11–51)

## 2023-10-18 LAB — CBC
HCT: 45.8 % (ref 39.0–52.0)
Hemoglobin: 15.8 g/dL (ref 13.0–17.0)
MCH: 30.4 pg (ref 26.0–34.0)
MCHC: 34.5 g/dL (ref 30.0–36.0)
MCV: 88.2 fL (ref 80.0–100.0)
Platelets: 202 10*3/uL (ref 150–400)
RBC: 5.19 MIL/uL (ref 4.22–5.81)
RDW: 13.8 % (ref 11.5–15.5)
WBC: 12.5 10*3/uL — ABNORMAL HIGH (ref 4.0–10.5)
nRBC: 0 % (ref 0.0–0.2)

## 2023-10-18 LAB — COMPREHENSIVE METABOLIC PANEL
ALT: 31 U/L (ref 0–44)
AST: 18 U/L (ref 15–41)
Albumin: 4.2 g/dL (ref 3.5–5.0)
Alkaline Phosphatase: 60 U/L (ref 38–126)
Anion gap: 9 (ref 5–15)
BUN: 17 mg/dL (ref 6–20)
CO2: 21 mmol/L — ABNORMAL LOW (ref 22–32)
Calcium: 9 mg/dL (ref 8.9–10.3)
Chloride: 106 mmol/L (ref 98–111)
Creatinine, Ser: 0.96 mg/dL (ref 0.61–1.24)
GFR, Estimated: >60 mL/min (ref >60)
Glucose, Bld: 172 mg/dL — ABNORMAL HIGH (ref 70–99)
Potassium: 3.9 mmol/L (ref 3.5–5.1)
Sodium: 136 mmol/L (ref 135–145)
Total Bilirubin: 0.8 mg/dL (ref 0.3–1.2)
Total Protein: 7 g/dL (ref 6.5–8.1)

## 2023-10-18 MED ORDER — IOHEXOL 300 MG/ML  SOLN
100.0000 mL | Freq: Once | INTRAMUSCULAR | Status: AC | PRN
  Administered 2023-10-18: 100 mL via INTRAVENOUS

## 2023-10-18 NOTE — ED Triage Notes (Signed)
Pt reports abd pain and emesis beginning 2 weeks ago. Pt was seen by PCP and they suspected something going on with his gallbladder. The pain has been intermittent. Pt is describing pain starting in his right groin and radiating into his back. Pt has hx kidney stones.

## 2023-10-19 ENCOUNTER — Emergency Department
Admission: EM | Admit: 2023-10-19 | Discharge: 2023-10-19 | Disposition: A | Discharge status: Home / Self Care | Payer: BC Managed Care – PPO | Attending: Emergency Medicine | Admitting: Emergency Medicine

## 2023-10-19 DIAGNOSIS — K8020 Calculus of gallbladder without cholecystitis without obstruction: Secondary | ICD-10-CM

## 2023-10-19 DIAGNOSIS — N200 Calculus of kidney: Secondary | ICD-10-CM

## 2023-10-19 MED ORDER — KETOROLAC TROMETHAMINE 15 MG/ML IJ SOLN
15.0000 mg | Freq: Once | INTRAMUSCULAR | Status: AC
  Administered 2023-10-19: 15 mg via INTRAVENOUS
  Filled 2023-10-19: qty 1

## 2023-10-19 MED ORDER — OXYCODONE HCL 5 MG PO TABS: 5.0000 mg | ORAL_TABLET | Freq: Three times a day (TID) | ORAL | 0 refills | Status: DC | PRN

## 2023-10-19 MED ORDER — OXYCODONE HCL 5 MG PO TABS
5.0000 mg | ORAL_TABLET | Freq: Once | ORAL | Status: AC
  Administered 2023-10-19: 5 mg via ORAL
  Filled 2023-10-19: qty 1

## 2023-10-19 MED ORDER — TAMSULOSIN HCL 0.4 MG PO CAPS: 0.4000 mg | ORAL_CAPSULE | Freq: Every day | ORAL | 0 refills | Status: DC

## 2023-10-19 MED ORDER — TAMSULOSIN HCL 0.4 MG PO CAPS: 0.4000 mg | ORAL_CAPSULE | Freq: Every day | ORAL | 0 refills | Status: AC

## 2023-10-19 MED ORDER — ACETAMINOPHEN 325 MG PO TABS
650.0000 mg | ORAL_TABLET | Freq: Once | ORAL | Status: AC
  Administered 2023-10-19: 650 mg via ORAL
  Filled 2023-10-19: qty 2

## 2023-10-19 NOTE — ED Provider Notes (Signed)
[facility] Provider Note    Event Date/Time   First MD Initiated Contact with Patient 10/19/23 0327     (approximate)   History   Abdominal Pain   HPI  Nicholas Greene. is a 57 y.o. male   Past medical history of CKD, GERD, hypertension hyperlipidemia presents the emergency department with right-sided flank pain radiating to the groin.  He denies dysuria or frequency.  He also has some right-sided upper abdominal pain postprandially longstanding unchanged with no associated nausea or vomiting, no fevers, no chills.  Independent Historian contributed to assessment above: His wife is at bedside to corroborate information past medical history as above  External Medical Documents Reviewed: Office visit dated October 25 documenting right upper quadrant pain radiating to the back      Physical Exam   Triage Vital Signs: ED Triage Vitals  Encounter Vitals Group     BP 10/18/23 2247 (!) 167/90     Systolic BP Percentile --      Diastolic BP Percentile --      Pulse Rate 10/18/23 2247 66     Resp 10/18/23 2247 18     Temp 10/18/23 2247 98.3 F (36.8 C)     Temp Source 10/18/23 2247 Oral     SpO2 10/18/23 2247 98 %     Weight 10/18/23 2246 210 lb (95.3 kg)     Height 10/18/23 2246 5\' 6"  (1.676 m)     Head Circumference --      Peak Flow --      Pain Score 10/18/23 2246 10     Pain Loc --      Pain Education --      Exclude from Growth Chart --     Most recent vital signs: Vitals:   10/18/23 2247 10/19/23 0358  BP: (!) 167/90 (!) 139/96  Pulse: 66 74  Resp: 18 16  Temp: 98.3 F (36.8 C) 98.4 F (36.9 C)  SpO2: 98% 97%    General: Awake, no distress.  CV:  Good peripheral perfusion.  Resp:  Normal effort.  Abd:  No distention.  Other:  Right-sided CVA tenderness, soft nontender abdomen to deep palpation all quadrants, negative Murphy sign.   ED Results / Procedures / Treatments   Labs (all labs ordered are listed, but only  abnormal results are displayed) Labs Reviewed  COMPREHENSIVE METABOLIC PANEL - Abnormal; Notable for the following components:      Result Value   CO2 21 (*)    Glucose, Bld 172 (*)    All other components within normal limits  CBC - Abnormal; Notable for the following components:   WBC 12.5 (*)    All other components within normal limits  URINALYSIS, ROUTINE W REFLEX MICROSCOPIC - Abnormal; Notable for the following components:   Color, Urine YELLOW (*)    APPearance HAZY (*)    Hgb urine dipstick LARGE (*)    Protein, ur 30 (*)    Bacteria, UA RARE (*)    All other components within normal limits  LIPASE, BLOOD     I ordered and reviewed the above labs they are notable for white blood cell count mildly elevated 12.5.  Rare bacteria with no leukocytes in the urine   RADIOLOGY I independently reviewed and interpreted CT of the abdomen and pelvis and see no obvious obstructive or inflammatory changes I also reviewed radiologist's formal read.   PROCEDURES:  Critical Care performed: No  Procedures  MEDICATIONS ORDERED IN ED: Medications  oxyCODONE (Oxy IR/ROXICODONE) immediate release tablet 5 mg (has no administration in time range)  iohexol (OMNIPAQUE) 300 MG/ML solution 100 mL (100 mLs Intravenous Contrast Given 10/18/23 2329)  ketorolac (TORADOL) 15 MG/ML injection 15 mg (15 mg Intravenous Given 10/19/23 0400)  oxyCODONE (Oxy IR/ROXICODONE) immediate release tablet 5 mg (5 mg Oral Given 10/19/23 0401)  acetaminophen (TYLENOL) tablet 650 mg (650 mg Oral Given 10/19/23 0400)     IMPRESSION / MDM / ASSESSMENT AND PLAN / ED COURSE  I reviewed the triage vital signs and the nursing notes.                                Patient's presentation is most consistent with acute presentation with potential threat to life or bodily function.  Differential diagnosis includes, but is not limited to, renal colic, kidney stone, obstructive uropathy, urinary tract infection,  cholelithiasis, biliary colic, cholecystitis    MDM:    Right-sided abdominal pain/flank pain with evidence of both cholelithiasis without cholecystitis, and a small ureterolithiasis.  I think his severe acute pain is due to the ureterolithiasis.  He has rare bacteria no inflammatory changes and no urinary symptoms so doubt urinary tract infection concurrently.  Pain was much improved after pain medications given in the emergency department.  I have started him on Flomax and he will follow-up with urology outpatient.  I think his postprandial right upper quadrant discomfort is due to his cholelithiasis that was evident on CT scan without evidence of cholecystitis.  Nontender currently to the right upper quadrant with negative Murphy sign I doubt he is having cholecystitis.  For this problem I gave him the contact information for general surgeon to follow-up as an outpatient for elective procedure  Plan is for discharge and follow-up as above.      FINAL CLINICAL IMPRESSION(S) / ED DIAGNOSES   Final diagnoses:  Kidney stone  Gallstones     Rx / DC Orders   ED Discharge Orders          Ordered    oxyCODONE (ROXICODONE) 5 MG immediate release tablet  Every 8 hours PRN        10/19/23 0635    tamsulosin (FLOMAX) 0.4 MG CAPS capsule  Daily        10/19/23 4010             Note:  This document was prepared using Dragon voice recognition software and may include unintentional dictation errors.    Pilar Jarvis, MD 10/19/23 [phone]

## 2023-10-19 NOTE — Discharge Instructions (Addendum)
For kidney stone pain -- Take acetaminophen 650 mg and ibuprofen 400 mg every 6 hours for pain.  Take with food.  If you have severe/breakthrough pain take oxycodone as prescribed.  Take Flomax daily as prescribed.  Call Dr. Vanna Scotland who is a urologist for follow-up appointment regarding your kidney stones   You are also have gallbladder stones.  This could account for some of your pain in the right side of your belly after you have meals.  Regarding your gallstones, you can call Dr. Maurine Minister who is a general surgeon who can discuss with you an elective surgery to take your gallbladder out if this continues to bother you.  Thank you for choosing [place] for your health care today!  Please see your primary doctor this week for a follow up appointment.   If you have any new, worsening, or unexpected symptoms call your doctor right away or come back to the emergency department for reevaluation.  It was my pleasure to care for you today.   Daneil Dan Modesto Charon, MD

## 2023-10-23 ENCOUNTER — Ambulatory Visit: Payer: BC Managed Care – PPO | Admitting: Surgery

## 2023-10-23 ENCOUNTER — Encounter: Payer: Self-pay | Admitting: Surgery

## 2023-10-23 VITALS — BP 118/75 | HR 62 | Temp 97.0°F | Ht 66.0 in | Wt 208.0 lb

## 2023-10-23 DIAGNOSIS — K8020 Calculus of gallbladder without cholecystitis without obstruction: Secondary | ICD-10-CM

## 2023-10-23 DIAGNOSIS — K429 Umbilical hernia without obstruction or gangrene: Secondary | ICD-10-CM

## 2023-10-23 NOTE — Patient Instructions (Addendum)
You have requested to have your gallbladder removed & umbilical hernia repaired. This will be done at [facility] with Dr. Aleen Campi.  You will most likely be out of work 1-2 weeks for this surgery.  If you have FMLA or disability paperwork that needs filled out you may drop this off at our office or this can be faxed to [phone].  You will return after your post-op appointment with a lifting restriction for approximately 4 more weeks.  You will be able to eat anything you would like to following surgery. But, start by eating a bland diet and advance this as tolerated. The Gallbladder diet is below, please go as closely by this diet as possible prior to surgery to avoid any further attacks.  Please see the (blue)pre-care form that you have been given today. Our surgery scheduler will call you to verify surgery date and to go over information.   If you have any questions, please call our office.  Laparoscopic Cholecystectomy Laparoscopic cholecystectomy is surgery to remove the gallbladder. The gallbladder is located in the upper right part of the abdomen, behind the liver. It is a storage sac for bile, which is produced in the liver. Bile aids in the digestion and absorption of fats. Cholecystectomy is often done for inflammation of the gallbladder (cholecystitis). This condition is usually caused by a buildup of gallstones (cholelithiasis) in the gallbladder. Gallstones can block the flow of bile, and that can result in inflammation and pain. In severe cases, emergency surgery may be required. If emergency surgery is not required, you will have time to prepare for the procedure. Laparoscopic surgery is an alternative to open surgery. Laparoscopic surgery has a shorter recovery time. Your common bile duct may also need to be examined during the procedure. If stones are found in the common bile duct, they may be removed. LET [facility] CARE PROVIDER KNOW ABOUT: Any allergies you have. All  medicines you are taking, including vitamins, herbs, eye drops, creams, and over-the-counter medicines. Previous problems you or members of your family have had with the use of anesthetics. Any blood disorders you have. Previous surgeries you have had.  Any medical conditions you have. RISKS AND COMPLICATIONS Generally, this is a safe procedure. However, problems may occur, including: Infection. Bleeding. Allergic reactions to medicines. Damage to other structures or organs. A stone remaining in the common bile duct. A bile leak from the cyst duct that is clipped when your gallbladder is removed. The need to convert to open surgery, which requires a larger incision in the abdomen. This may be necessary if your surgeon thinks that it is not safe to continue with a laparoscopic procedure. BEFORE THE PROCEDURE Ask your health care provider about: Changing or stopping your regular medicines. This is especially important if you are taking diabetes medicines or blood thinners. Taking medicines such as aspirin and ibuprofen. These medicines can thin your blood. Do not take these medicines before your procedure if your health care provider instructs you not to. Follow instructions from your health care provider about eating or drinking restrictions. Let your health care provider know if you develop a cold or an infection before surgery. Plan to have someone take you home after the procedure. Ask your health care provider how your surgical site will be marked or identified. You may be given antibiotic medicine to help prevent infection. PROCEDURE To reduce your risk of infection: Your health care team will wash or sanitize their hands. Your skin will be washed  with soap. An IV tube may be inserted into one of your veins. You will be given a medicine to make you fall asleep (general anesthetic). A breathing tube will be placed in your mouth. The surgeon will make several small cuts (incisions)  in your abdomen. A thin, lighted tube (laparoscope) that has a tiny camera on the end will be inserted through one of the small incisions. The camera on the laparoscope will send a picture to a TV screen (monitor) in the operating room. This will give the surgeon a good view inside your abdomen. A gas will be pumped into your abdomen. This will expand your abdomen to give the surgeon more room to perform the surgery. Other tools that are needed for the procedure will be inserted through the other incisions. The gallbladder will be removed through one of the incisions. After your gallbladder has been removed, the incisions will be closed with stitches (sutures), staples, or skin glue. Your incisions may be covered with a bandage (dressing). The procedure may vary among health care providers and hospitals. AFTER THE PROCEDURE Your blood pressure, heart rate, breathing rate, and blood oxygen level will be monitored often until the medicines you were given have worn off. You will be given medicines as needed to control your pain.   This information is not intended to replace advice given to you by your health care provider. Make sure you discuss any questions you have with your health care provider.   Document Released: 12/05/2005 Document Revised: 08/26/2015 Document Reviewed: 07/17/2013 Elsevier Interactive Patient Education 2016 [employer]   Low-Fat Diet for Gallbladder Conditions A low-fat diet can be helpful if you have pancreatitis or a gallbladder condition. With these conditions, your pancreas and gallbladder have trouble digesting fats. A healthy eating plan with less fat will help rest your pancreas and gallbladder and reduce your symptoms. WHAT DO I NEED TO KNOW ABOUT THIS DIET? Eat a low-fat diet. Reduce your fat intake to less than 20-30% of your total daily calories. This is less than 50-60 g of fat per day. Remember that you need some fat in your diet. Ask your dietician what  your daily goal should be. Choose nonfat and low-fat healthy foods. Look for the words "nonfat," "low fat," or "fat free." As a guide, look on the label and choose foods with less than 3 g of fat per serving. Eat only one serving. Avoid alcohol. Do not smoke. If you need help quitting, talk with your health care provider. Eat small frequent meals instead of three large heavy meals. WHAT FOODS CAN I EAT? Grains Include healthy grains and starches such as potatoes, wheat bread, fiber-rich cereal, and brown rice. Choose whole grain options whenever possible. In adults, whole grains should account for 45-65% of your daily calories.  Fruits and Vegetables Eat plenty of fruits and vegetables. Fresh fruits and vegetables add fiber to your diet. Meats and Other Protein Sources Eat lean meat such as chicken and pork. Trim any fat off of meat before cooking it. Eggs, fish, and beans are other sources of protein. In adults, these foods should account for 10-35% of your daily calories. Dairy Choose low-fat milk and dairy options. Dairy includes fat and protein, as well as calcium.  Fats and Oils Limit high-fat foods such as fried foods, sweets, baked goods, sugary drinks.  Other Creamy sauces and condiments, such as mayonnaise, can add extra fat. Think about whether or not you need to use them, or use smaller amounts  or low fat options. WHAT FOODS ARE NOT RECOMMENDED? High fat foods, such as: [employer]. Ice cream. [place] toast. Sweet rolls. Pizza. Cheese bread. Foods covered with batter, butter, creamy sauces, or cheese. Fried foods. Sugary drinks and desserts. Foods that cause gas or bloating   Umbilical Hernia, Adult  A hernia is a lump of tissue that pushes through an opening in the muscles. An umbilical hernia happens in the belly, near the belly button. The hernia may contain tissues from the small or large intestine. It may also have fatty tissue that covers the intestines. Umbilical  hernias in adults may get worse over time. They need to be treated with surgery. There are several types of umbilical hernias. They include: Indirect hernia. This occurs just above or below the belly button. It's the most common type of umbilical hernia in adults. Direct hernia. This type occurs in an opening that's formed by the belly button. Reducible hernia. This hernia comes and goes. You may see it only when you strain, cough, or lift something heavy. This type of hernia can be pushed back into the belly (reduced). Incarcerated hernia. This traps the hernia in the wall of the belly. This type of hernia can't be pushed back into the belly. It can cause a strangulated hernia. Strangulated hernia. This hernia cuts off blood flow to the tissues inside the hernia. The tissues can die if this happens. This type of hernia must be treated right away. What are the causes? An umbilical hernia happens when tissue inside the belly pushes through an opening in the muscles of the belly. What increases the risk? You're more likely to get this hernia if: You strain while lifting or pushing heavy objects. You've had several pregnancies. You have a condition that puts pressure on your belly, and you've had it for a long time. These include: Obesity. A buildup of fluid inside your belly. Vomiting or coughing all the time. Trouble pooping (constipation). You've had surgery that weakened the muscles in the belly. What are the signs or symptoms? The main symptom of this condition is a bulge at the belly button or near it. The bulge does not cause pain. Other symptoms depend on the type of hernia you have. A reducible hernia may be seen only when you strain, cough, or lift something heavy. Other symptoms may include: Dull pain. A feeling of pressure. An incarcerated hernia may cause very bad pain. Also, you may: Vomit or feel like you may vomit. Not be able to pass gas. A strangulated hernia may  cause: Pain that gets worse and worse. Vomiting, or feeling like you may vomit. Pain when you press on the hernia. Change of color on the skin over the hernia. The skin may become red or purple. Trouble pooping. Blood in the poop. How is this diagnosed? This condition may be diagnosed based on: Your symptoms and medical history. A physical exam. You may be asked to cough or strain while standing. These actions will put pressure inside your belly. The pressure can force the hernia through the opening in your muscles. Your health care provider may try to push the hernia back into your belly (reduce). How is this treated? Surgery is the only treatment for an umbilical hernia. Surgery for a strangulated hernia must be done right away. If you have a small hernia that's not incarcerated, you may need to lose weight before the surgery is done. Follow these instructions at home: Managing constipation You may need to take these  actions to prevent trouble pooping. This will help to prevent straining. Drink enough fluid to keep your pee (urine) pale yellow. Take over-the-counter or prescription medicines. Eat foods that are high in fiber, such as beans, whole grains, and fresh fruits and vegetables. Limit foods that are high in fat and sugars, such as fried or sweet foods. General instructions Do not try to push the hernia back in. Lose weight, if told by your provider. Watch your hernia for any changes in color or size. Tell your provider if any changes occur. You may need to avoid activities that put pressure on your hernia. You may have to avoid lifting. Ask your provider how much you can safely lift. Take over-the-counter and prescription medicines only as told by your provider. Contact a health care provider if: Your hernia gets larger or feels hard. Your hernia becomes painful. You get a fever or chills. Get help right away if: You get very bad pain near the area of the hernia, and the  pain comes on suddenly. You have pain and you vomit or feel like you may vomit. The skin over your hernia changes color. These symptoms may be an emergency. Get help right away. Call 911. Do not wait to see if the symptoms go away. Do not drive yourself to the hospital.

## 2023-10-23 NOTE — H&P (View-Only) (Signed)
10/23/2023  Reason for Visit:  Symptomatic cholelithiasis  History of Present Illness: Nicholas Greene. is a 57 y.o. male presenting for evaluation of symptomatic cholelithiasis.  Patient reports a 2-week history of right upper quadrant abdominal pain.  He saw initially his PCP on 10/13/2023 for this.  He then presented on 10/18/2023 to the emergency room with right flank pain as well as right upper quadrant pain.  He had a CT scan of the abdomen pelvis which showed a right ureter stone with mild hydroureteronephrosis but also showed cholelithiasis.  The patient does have a history of kidney stones in the past and he reports that his right upper quadrant pain is not the same as his kidney stone pain.  His pain does get aggravated with p.o. intake and is associated with nausea and vomiting.  He denies any chest pain, shortness of breath, fevers, chills.  Past Medical History: Past Medical History:  Diagnosis Date   Anxiety    Arthritis    Chronic kidney disease    kidney stones since age 71   GERD (gastroesophageal reflux disease)    with tomatoe products   Hypercholesteremia    Hypertension    Sleep apnea    cpap bring machine tested 10-12 yrs ago     Past Surgical History: Past Surgical History:  Procedure Laterality Date   BACK SURGERY     Jan 17 2014   CATARACT EXTRACTION W/PHACO Left 11/23/2022   Procedure: CATARACT EXTRACTION PHACO AND INTRAOCULAR LENS PLACEMENT (IOC) LEFT;  Surgeon: Lockie Mola, MD;  Location: [facility];  Service: Ophthalmology;  Laterality: Left;  4.70 0:48.5   COLONOSCOPY WITH PROPOFOL N/A 05/02/2023   Procedure: COLONOSCOPY WITH PROPOFOL;  Surgeon: Midge Minium, MD;  Location: [facility] ENDOSCOPY;  Service: Endoscopy;  Laterality: N/A;   CYSTOSCOPY  2007   CYSTOSCOPY/URETEROSCOPY/HOLMIUM LASER/STENT PLACEMENT Left 08/14/2017   Procedure: CYSTOSCOPY/URETEROSCOPY/HOLMIUM LASER/STENT PLACEMENT;  Surgeon: Vanna Scotland, MD;  Location: [facility]  ORS;  Service: Urology;  Laterality: Left;   MAXIMUM ACCESS (MAS)POSTERIOR LUMBAR INTERBODY FUSION (PLIF) 1 LEVEL N/A 08/11/2014   Procedure: FOR MAXIMUM ACCESS (MAS) POSTERIOR LUMBAR INTERBODY FUSION (PLIF) LUMBAR FIVE-SACRAL ONE;  Surgeon: Temple Pacini, MD;  Location: [facility] NEURO ORS;  Service: Neurosurgery;  Laterality: N/A;  FOR MAXIMUM ACCESS (MAS) POSTERIOR LUMBAR INTERBODY FUSION (PLIF) LUMBAR FIVE-SACRAL ONE   SHOULDER ARTHROSCOPY Left 06/24/2022   Procedure: Left shoulder arthroscopic rotator cuff repair, subacromial decompression, and arthroscopic biceps tenodesis, distal clavical excision;  Surgeon: Signa Kell, MD;  Location: [facility];  Service: Orthopedics;  Laterality: Left;    Home Medications: Prior to Admission medications   Medication Sig Start Date End Date Taking? Authorizing Provider  atorvastatin (LIPITOR) 20 MG tablet Take 1 tablet by mouth daily. 05/02/23  Yes [provider]  diazepam (VALIUM) 5 MG tablet Take 5 mg by mouth every 6 (six) hours as needed for anxiety.   Yes [provider]  loratadine-pseudoephedrine (CLARITIN-D 24-HOUR) 10-240 MG 24 hr tablet Take 1 tablet by mouth daily.   Yes [provider]  ondansetron (ZOFRAN-ODT) 4 MG disintegrating tablet Take 1 tablet (4 mg total) by mouth every 8 (eight) hours as needed for nausea or vomiting. 06/24/22  Yes Signa Kell, MD  oxyCODONE (ROXICODONE) 5 MG immediate release tablet Take 1 tablet (5 mg total) by mouth every 8 (eight) hours as needed for up to 12 doses. 10/19/23  Yes Pilar Jarvis, MD  tamsulosin (FLOMAX) 0.4 MG CAPS capsule Take 1 capsule (0.4  mg total) by mouth daily. 10/19/23 11/18/23 Yes Pilar Jarvis, MD    Allergies: Allergies  Allergen Reactions   Varenicline Other (See Comments)   Cortisone Acetate [Cortisone] Other (See Comments)    Shots caused redness and swelling at injection sites    Social History:  reports that he has been smoking cigarettes. He has been  exposed to tobacco smoke. He has never used smokeless tobacco. He reports current alcohol use. He reports current drug use. Drug: Marijuana.   Family History: Family History  Problem Relation Age of Onset   Hematuria Mother    Lung cancer Mother    Prostate cancer Father    Kidney cancer Neg Hx    Bladder Cancer Neg Hx     Review of Systems: Review of Systems  Constitutional:  Negative for chills and fever.  HENT:  Negative for hearing loss.   Respiratory:  Negative for shortness of breath.   Cardiovascular:  Negative for chest pain.  Gastrointestinal:  Positive for abdominal pain, nausea and vomiting.  Genitourinary:  Positive for flank pain. Negative for dysuria.  Musculoskeletal:  Negative for myalgias.  Skin:  Negative for rash.  Neurological:  Negative for dizziness.  Psychiatric/Behavioral:  Negative for depression.     Physical Exam BP 118/75   Pulse 62   Temp (!) 97 F (36.1 C)   Ht 5\' 6"  (1.676 m)   Wt 208 lb (94.3 kg)   SpO2 96%   BMI 33.57 kg/m  CONSTITUTIONAL: No acute distress HEENT:  Normocephalic, atraumatic, extraocular motion intact. NECK: Trachea is midline, and there is no jugular venous distension.  RESPIRATORY:  Lungs are clear, and breath sounds are equal bilaterally. Normal respiratory effort without pathologic use of accessory muscles. CARDIOVASCULAR: Heart is regular without murmurs, gallops, or rubs. GI: The abdomen is soft, nondistended, with some tenderness to palpation in the right upper quadrant.  Negative Murphy's sign.  Patient also has a very small umbilical hernia which is reducible.   MUSCULOSKELETAL:  Normal muscle strength and tone in all four extremities.  No peripheral edema or cyanosis. SKIN: Skin turgor is normal. There are no pathologic skin lesions.  NEUROLOGIC:  Motor and sensation is grossly normal.  Cranial nerves are grossly intact. PSYCH:  Alert and oriented to person, place and time. Affect is normal.  Laboratory  Analysis: Labs from 10/18/2023: Sodium 136, potassium 3.9, chloride 106, CO2 21, BUN 17, creatinine 0.96.  Total bilirubin 0.8, AST 18, ALT 31, alkaline phosphatase 60, albumin 4.2.  WBC 12.5, hemoglobin 15.8, hematocrit 45.8, platelets 202.  Imaging: CT abdomen/pelvis on 10/18/2023: IMPRESSION: 1. 4 mm obstructing stone in the proximal right ureter. 2. Additional punctate nonobstructing stones bilaterally. 3. Cholelithiasis without evidence of acute cholecystitis. 4. Hepatic steatosis. 5. Pancreatic cystic lesions measuring up to 8 mm. These are stable since 08/03/2017 in may represent IPMNs. Follow-up CT or MRI with IV contrast is recommended in 2 years. 6. 1.2 cm nodule in the right lower lobe. Prompt nonemergent chest CT is recommended for further evaluation.   Aortic Atherosclerosis (ICD10-I70.0).  Assessment and Plan: This is a 57 y.o. male with symptomatic cholelithiasis.  - Discussed with the patient the findings on his CT scan and laboratory studies.  Although he does have a right ureteral stone with changes in his urinalysis showing microscopic hematuria, the pain that he is currently having in the right upper quadrant is different in his opinion compared to the pain that he usually has with kidney stones.  Also this  pain is associated with p.o. intake which would not be the case for kidney stone issues.  As such, based on the findings and symptoms, I do believe that his cholelithiasis is playing a role in his pain.  As such, discussed with the patient the recommendation for a cholecystectomy.  He is in agreement. - Discussed with him the plan for robotic assisted cholecystectomy and reviewed the surgery at length with him including the planned incisions, risks of bleeding, infection, injury to surrounding structures, that this would be an outpatient procedure, the use of ICG to better evaluate the biliary anatomy, postoperative activity restrictions, pain control, and he is willing  to proceed. - We will schedule the patient for surgery on 10/25/2023.  All of his questions have been answered.  I spent 60 minutes dedicated to the care of this patient on the date of this encounter to include pre-visit review of records, face-to-face time with the patient discussing diagnosis and management, and any post-visit coordination of care.   Howie Ill, MD [facility]

## 2023-10-23 NOTE — Progress Notes (Unsigned)
10/23/2023  Reason for Visit:  Symptomatic cholelithiasis  History of Present Illness: Nicholas Greene. is a 57 y.o. male presenting for evaluation of symptomatic cholelithiasis.  Patient reports a 2-week history of right upper quadrant abdominal pain.  He saw initially his PCP on 10/13/2023 for this.  He then presented on 10/18/2023 to the emergency room with right flank pain as well as right upper quadrant pain.  He had a CT scan of the abdomen pelvis which showed a right ureter stone with mild hydroureteronephrosis but also showed cholelithiasis.  The patient does have a history of kidney stones in the past and he reports that his right upper quadrant pain is not the same as his kidney stone pain.  His pain does get aggravated with p.o. intake and is associated with nausea and vomiting.  He denies any chest pain, shortness of breath, fevers, chills.  Past Medical History: Past Medical History:  Diagnosis Date   Anxiety    Arthritis    Chronic kidney disease    kidney stones since age 71   GERD (gastroesophageal reflux disease)    with tomatoe products   Hypercholesteremia    Hypertension    Sleep apnea    cpap bring machine tested 10-12 yrs ago     Past Surgical History: Past Surgical History:  Procedure Laterality Date   BACK SURGERY     Jan 17 2014   CATARACT EXTRACTION W/PHACO Left 11/23/2022   Procedure: CATARACT EXTRACTION PHACO AND INTRAOCULAR LENS PLACEMENT (IOC) LEFT;  Surgeon: Lockie Mola, MD;  Location: [facility];  Service: Ophthalmology;  Laterality: Left;  4.70 0:48.5   COLONOSCOPY WITH PROPOFOL N/A 05/02/2023   Procedure: COLONOSCOPY WITH PROPOFOL;  Surgeon: Midge Minium, MD;  Location: [facility] ENDOSCOPY;  Service: Endoscopy;  Laterality: N/A;   CYSTOSCOPY  2007   CYSTOSCOPY/URETEROSCOPY/HOLMIUM LASER/STENT PLACEMENT Left 08/14/2017   Procedure: CYSTOSCOPY/URETEROSCOPY/HOLMIUM LASER/STENT PLACEMENT;  Surgeon: Vanna Scotland, MD;  Location: [facility]  ORS;  Service: Urology;  Laterality: Left;   MAXIMUM ACCESS (MAS)POSTERIOR LUMBAR INTERBODY FUSION (PLIF) 1 LEVEL N/A 08/11/2014   Procedure: FOR MAXIMUM ACCESS (MAS) POSTERIOR LUMBAR INTERBODY FUSION (PLIF) LUMBAR FIVE-SACRAL ONE;  Surgeon: Temple Pacini, MD;  Location: [facility];  Service: Neurosurgery;  Laterality: N/A;  FOR MAXIMUM ACCESS (MAS) POSTERIOR LUMBAR INTERBODY FUSION (PLIF) LUMBAR FIVE-SACRAL ONE   SHOULDER ARTHROSCOPY Left 06/24/2022   Procedure: Left shoulder arthroscopic rotator cuff repair, subacromial decompression, and arthroscopic biceps tenodesis, distal clavical excision;  Surgeon: Signa Kell, MD;  Location: [facility];  Service: Orthopedics;  Laterality: Left;    Home Medications: Prior to Admission medications   Medication Sig Start Date End Date Taking? Authorizing Provider  atorvastatin (LIPITOR) 20 MG tablet Take 1 tablet by mouth daily. 05/02/23  Yes [provider]  diazepam (VALIUM) 5 MG tablet Take 5 mg by mouth every 6 (six) hours as needed for anxiety.   Yes [provider]  loratadine-pseudoephedrine (CLARITIN-D 24-HOUR) 10-240 MG 24 hr tablet Take 1 tablet by mouth daily.   Yes [provider]  ondansetron (ZOFRAN-ODT) 4 MG disintegrating tablet Take 1 tablet (4 mg total) by mouth every 8 (eight) hours as needed for nausea or vomiting. 06/24/22  Yes Signa Kell, MD  oxyCODONE (ROXICODONE) 5 MG immediate release tablet Take 1 tablet (5 mg total) by mouth every 8 (eight) hours as needed for up to 12 doses. 10/19/23  Yes Pilar Jarvis, MD  tamsulosin (FLOMAX) 0.4 MG CAPS capsule Take 1 capsule (0.4  mg total) by mouth daily. 10/19/23 11/18/23 Yes Pilar Jarvis, MD    Allergies: Allergies  Allergen Reactions   Varenicline Other (See Comments)   Cortisone Acetate [Cortisone] Other (See Comments)    Shots caused redness and swelling at injection sites    Social History:  reports that he has been smoking cigarettes. He has been  exposed to tobacco smoke. He has never used smokeless tobacco. He reports current alcohol use. He reports current drug use. Drug: Marijuana.   Family History: Family History  Problem Relation Age of Onset   Hematuria Mother    Lung cancer Mother    Prostate cancer Father    Kidney cancer Neg Hx    Bladder Cancer Neg Hx     Review of Systems: Review of Systems  Constitutional:  Negative for chills and fever.  HENT:  Negative for hearing loss.   Respiratory:  Negative for shortness of breath.   Cardiovascular:  Negative for chest pain.  Gastrointestinal:  Positive for abdominal pain, nausea and vomiting.  Genitourinary:  Positive for flank pain. Negative for dysuria.  Musculoskeletal:  Negative for myalgias.  Skin:  Negative for rash.  Neurological:  Negative for dizziness.  Psychiatric/Behavioral:  Negative for depression.     Physical Exam BP 118/75   Pulse 62   Temp (!) 97 F (36.1 C)   Ht 5\' 6"  (1.676 m)   Wt 208 lb (94.3 kg)   SpO2 96%   BMI 33.57 kg/m  CONSTITUTIONAL: No acute distress HEENT:  Normocephalic, atraumatic, extraocular motion intact. NECK: Trachea is midline, and there is no jugular venous distension.  RESPIRATORY:  Lungs are clear, and breath sounds are equal bilaterally. Normal respiratory effort without pathologic use of accessory muscles. CARDIOVASCULAR: Heart is regular without murmurs, gallops, or rubs. GI: The abdomen is soft, nondistended, with some tenderness to palpation in the right upper quadrant.  Negative Murphy's sign.  Patient also has a very small umbilical hernia which is reducible.   MUSCULOSKELETAL:  Normal muscle strength and tone in all four extremities.  No peripheral edema or cyanosis. SKIN: Skin turgor is normal. There are no pathologic skin lesions.  NEUROLOGIC:  Motor and sensation is grossly normal.  Cranial nerves are grossly intact. PSYCH:  Alert and oriented to person, place and time. Affect is normal.  Laboratory  Analysis: Labs from 10/18/2023: Sodium 136, potassium 3.9, chloride 106, CO2 21, BUN 17, creatinine 0.96.  Total bilirubin 0.8, AST 18, ALT 31, alkaline phosphatase 60, albumin 4.2.  WBC 12.5, hemoglobin 15.8, hematocrit 45.8, platelets 202.  Imaging: CT abdomen/pelvis on 10/18/2023: IMPRESSION: 1. 4 mm obstructing stone in the proximal right ureter. 2. Additional punctate nonobstructing stones bilaterally. 3. Cholelithiasis without evidence of acute cholecystitis. 4. Hepatic steatosis. 5. Pancreatic cystic lesions measuring up to 8 mm. These are stable since 08/03/2017 in may represent IPMNs. Follow-up CT or MRI with IV contrast is recommended in 2 years. 6. 1.2 cm nodule in the right lower lobe. Prompt nonemergent chest CT is recommended for further evaluation.   Aortic Atherosclerosis (ICD10-I70.0).  Assessment and Plan: This is a 57 y.o. male with symptomatic cholelithiasis.  - Discussed with the patient the findings on his CT scan and laboratory studies.  Although he does have a right ureteral stone with changes in his urinalysis showing microscopic hematuria, the pain that he is currently having in the right upper quadrant is different in his opinion compared to the pain that he usually has with kidney stones.  Also this  pain is associated with p.o. intake which would not be the case for kidney stone issues.  As such, based on the findings and symptoms, I do believe that his cholelithiasis is playing a role in his pain.  As such, discussed with the patient the recommendation for a cholecystectomy.  He is in agreement. - Discussed with him the plan for robotic assisted cholecystectomy and reviewed the surgery at length with him including the planned incisions, risks of bleeding, infection, injury to surrounding structures, that this would be an outpatient procedure, the use of ICG to better evaluate the biliary anatomy, postoperative activity restrictions, pain control, and he is willing  to proceed. - We will schedule the patient for surgery on 10/25/2023.  All of his questions have been answered.  I spent 60 minutes dedicated to the care of this patient on the date of this encounter to include pre-visit review of records, face-to-face time with the patient discussing diagnosis and management, and any post-visit coordination of care.   Howie Ill, MD [facility]

## 2023-10-24 ENCOUNTER — Telehealth: Payer: Self-pay | Admitting: Surgery

## 2023-10-24 MED ORDER — CHLORHEXIDINE GLUCONATE CLOTH 2 % EX PADS: 6.0000 | MEDICATED_PAD | Freq: Once | CUTANEOUS | Status: DC

## 2023-10-24 MED ORDER — INDOCYANINE GREEN 25 MG IV SOLR
1.2500 mg | INTRAVENOUS | Status: AC
  Administered 2023-10-25: 1.25 mg via INTRAVENOUS

## 2023-10-24 MED ORDER — BUPIVACAINE LIPOSOME 1.3 % IJ SUSP: 20.0000 mL | Freq: Once | INTRAMUSCULAR | Status: DC

## 2023-10-24 MED ORDER — GABAPENTIN 300 MG PO CAPS
300.0000 mg | ORAL_CAPSULE | ORAL | Status: AC
Start: 2023-10-25 — End: 2023-10-26
  Administered 2023-10-25: 300 mg via ORAL

## 2023-10-24 MED ORDER — ACETAMINOPHEN 500 MG PO TABS
1000.0000 mg | ORAL_TABLET | ORAL | Status: AC
  Administered 2023-10-25: 1000 mg via ORAL

## 2023-10-24 MED ORDER — CHLORHEXIDINE GLUCONATE CLOTH 2 % EX PADS
6.0000 | MEDICATED_PAD | Freq: Once | CUTANEOUS | Status: DC
  Administered 2023-10-25: 6 via TOPICAL

## 2023-10-24 MED ORDER — CEFAZOLIN SODIUM-DEXTROSE 2-4 GM/100ML-% IV SOLN
2.0000 g | INTRAVENOUS | Status: AC
  Administered 2023-10-25: 2 g via INTRAVENOUS

## 2023-10-24 NOTE — Telephone Encounter (Signed)
Patient has been advised of Pre-Admission date/time, and Surgery date at [facility].  Surgery Date: 10/25/23 Preadmission Testing Date: 10/25/23, since add on for surgery the next day, patient instructed to arrive 2 hours early (8:30 am) for his surgery at [facility].   Patient also reminded to not eat or drink after midnight.  Patient verbalized understanding.

## 2023-10-25 ENCOUNTER — Other Ambulatory Visit: Payer: Self-pay

## 2023-10-25 ENCOUNTER — Encounter
Admission: RE | Disposition: A | Discharge status: Home / Self Care | Payer: Self-pay | Source: Home / Self Care | Attending: Surgery

## 2023-10-25 ENCOUNTER — Encounter: Payer: Self-pay | Admitting: Surgery

## 2023-10-25 ENCOUNTER — Ambulatory Visit: Payer: BC Managed Care – PPO | Admitting: Registered Nurse

## 2023-10-25 ENCOUNTER — Ambulatory Visit
Admission: RE | Admit: 2023-10-25 | Discharge: 2023-10-25 | Disposition: A | Discharge status: Home / Self Care | Payer: BC Managed Care – PPO | Attending: Surgery | Admitting: Surgery

## 2023-10-25 DIAGNOSIS — K429 Umbilical hernia without obstruction or gangrene: Secondary | ICD-10-CM | POA: Diagnosis not present

## 2023-10-25 DIAGNOSIS — N189 Chronic kidney disease, unspecified: Secondary | ICD-10-CM | POA: Insufficient documentation

## 2023-10-25 DIAGNOSIS — G4730 Sleep apnea, unspecified: Secondary | ICD-10-CM | POA: Diagnosis not present

## 2023-10-25 DIAGNOSIS — I129 Hypertensive chronic kidney disease with stage 1 through stage 4 chronic kidney disease, or unspecified chronic kidney disease: Secondary | ICD-10-CM | POA: Diagnosis not present

## 2023-10-25 DIAGNOSIS — F419 Anxiety disorder, unspecified: Secondary | ICD-10-CM | POA: Diagnosis not present

## 2023-10-25 DIAGNOSIS — K862 Cyst of pancreas: Secondary | ICD-10-CM | POA: Diagnosis not present

## 2023-10-25 DIAGNOSIS — N132 Hydronephrosis with renal and ureteral calculous obstruction: Secondary | ICD-10-CM | POA: Insufficient documentation

## 2023-10-25 DIAGNOSIS — K760 Fatty (change of) liver, not elsewhere classified: Secondary | ICD-10-CM | POA: Diagnosis not present

## 2023-10-25 DIAGNOSIS — K8020 Calculus of gallbladder without cholecystitis without obstruction: Secondary | ICD-10-CM

## 2023-10-25 DIAGNOSIS — K8010 Calculus of gallbladder with chronic cholecystitis without obstruction: Secondary | ICD-10-CM | POA: Insufficient documentation

## 2023-10-25 HISTORY — PX: UMBILICAL HERNIA REPAIR: SHX196

## 2023-10-25 SURGERY — CHOLECYSTECTOMY, ROBOT-ASSISTED, LAPAROSCOPIC
Anesthesia: General

## 2023-10-25 MED ORDER — ROCURONIUM BROMIDE 10 MG/ML (PF) SYRINGE
PREFILLED_SYRINGE | INTRAVENOUS | Status: AC
  Filled 2023-10-25: qty 10

## 2023-10-25 MED ORDER — BUPIVACAINE LIPOSOME 1.3 % IJ SUSP
INTRAMUSCULAR | Status: AC
  Filled 2023-10-25: qty 10

## 2023-10-25 MED ORDER — PROPOFOL 10 MG/ML IV BOLUS
INTRAVENOUS | Status: AC
  Filled 2023-10-25: qty 40

## 2023-10-25 MED ORDER — GABAPENTIN 300 MG PO CAPS
ORAL_CAPSULE | ORAL | Status: AC
  Filled 2023-10-25: qty 1

## 2023-10-25 MED ORDER — OXYCODONE HCL 5 MG PO TABS: 5.0000 mg | ORAL_TABLET | ORAL | 0 refills | Status: DC | PRN

## 2023-10-25 MED ORDER — LACTATED RINGERS IV SOLN: INTRAVENOUS | Status: DC

## 2023-10-25 MED ORDER — HYDROMORPHONE HCL 1 MG/ML IJ SOLN
INTRAMUSCULAR | Status: DC | PRN
  Administered 2023-10-25 (×2): .5 mg via INTRAVENOUS

## 2023-10-25 MED ORDER — ACETAMINOPHEN 500 MG PO TABS
ORAL_TABLET | ORAL | Status: AC
  Filled 2023-10-25: qty 2

## 2023-10-25 MED ORDER — MIDAZOLAM HCL 2 MG/2ML IJ SOLN
INTRAMUSCULAR | Status: DC | PRN
  Administered 2023-10-25: 2 mg via INTRAVENOUS

## 2023-10-25 MED ORDER — BUPIVACAINE-EPINEPHRINE (PF) 0.25% -1:200000 IJ SOLN
INTRAMUSCULAR | Status: AC
  Filled 2023-10-25: qty 30

## 2023-10-25 MED ORDER — IBUPROFEN 800 MG PO TABS: 800.0000 mg | ORAL_TABLET | Freq: Three times a day (TID) | ORAL | 1 refills | Status: AC | PRN

## 2023-10-25 MED ORDER — ORAL CARE MOUTH RINSE: 15.0000 mL | Freq: Once | OROMUCOSAL | Status: AC

## 2023-10-25 MED ORDER — BUPIVACAINE-EPINEPHRINE (PF) 0.5% -1:200000 IJ SOLN
INTRAMUSCULAR | Status: DC | PRN
  Administered 2023-10-25: 30 mL

## 2023-10-25 MED ORDER — DEXAMETHASONE SODIUM PHOSPHATE 10 MG/ML IJ SOLN
INTRAMUSCULAR | Status: DC | PRN
  Administered 2023-10-25: 8 mg via INTRAVENOUS

## 2023-10-25 MED ORDER — ROCURONIUM BROMIDE 100 MG/10ML IV SOLN
INTRAVENOUS | Status: DC | PRN
  Administered 2023-10-25: 20 mg via INTRAVENOUS
  Administered 2023-10-25: 10 mg via INTRAVENOUS
  Administered 2023-10-25: 50 mg via INTRAVENOUS

## 2023-10-25 MED ORDER — MIDAZOLAM HCL 2 MG/2ML IJ SOLN
INTRAMUSCULAR | Status: AC
  Filled 2023-10-25: qty 2

## 2023-10-25 MED ORDER — FENTANYL CITRATE (PF) 100 MCG/2ML IJ SOLN
INTRAMUSCULAR | Status: AC
  Filled 2023-10-25: qty 2

## 2023-10-25 MED ORDER — ACETAMINOPHEN 325 MG PO TABS: 650.0000 mg | ORAL_TABLET | Freq: Four times a day (QID) | ORAL | Status: AC | PRN

## 2023-10-25 MED ORDER — OXYCODONE HCL 5 MG PO TABS
5.0000 mg | ORAL_TABLET | Freq: Once | ORAL | Status: AC
Start: 2023-10-25 — End: 2023-10-25
  Administered 2023-10-25: 5 mg via ORAL

## 2023-10-25 MED ORDER — FENTANYL CITRATE (PF) 100 MCG/2ML IJ SOLN
25.0000 ug | INTRAMUSCULAR | Status: DC | PRN
  Administered 2023-10-25 (×2): 50 ug via INTRAVENOUS

## 2023-10-25 MED ORDER — HYDROMORPHONE HCL 1 MG/ML IJ SOLN
INTRAMUSCULAR | Status: AC
  Filled 2023-10-25: qty 1

## 2023-10-25 MED ORDER — CEFAZOLIN SODIUM-DEXTROSE 2-4 GM/100ML-% IV SOLN
INTRAVENOUS | Status: AC
  Filled 2023-10-25: qty 100

## 2023-10-25 MED ORDER — INDOCYANINE GREEN 25 MG IV SOLR
INTRAVENOUS | Status: AC
  Filled 2023-10-25: qty 10

## 2023-10-25 MED ORDER — DROPERIDOL 2.5 MG/ML IJ SOLN: 0.6250 mg | Freq: Once | INTRAMUSCULAR | Status: DC | PRN

## 2023-10-25 MED ORDER — ONDANSETRON HCL 4 MG/2ML IJ SOLN
INTRAMUSCULAR | Status: DC | PRN
  Administered 2023-10-25: 4 mg via INTRAVENOUS

## 2023-10-25 MED ORDER — DEXAMETHASONE SODIUM PHOSPHATE 10 MG/ML IJ SOLN
INTRAMUSCULAR | Status: AC
  Filled 2023-10-25: qty 1

## 2023-10-25 MED ORDER — BUPIVACAINE LIPOSOME 1.3 % IJ SUSP
INTRAMUSCULAR | Status: DC | PRN
  Administered 2023-10-25: 10 mL

## 2023-10-25 MED ORDER — ONDANSETRON HCL 4 MG/2ML IJ SOLN
INTRAMUSCULAR | Status: AC
  Filled 2023-10-25: qty 2

## 2023-10-25 MED ORDER — FENTANYL CITRATE (PF) 100 MCG/2ML IJ SOLN
INTRAMUSCULAR | Status: DC | PRN
  Administered 2023-10-25: 100 ug via INTRAVENOUS

## 2023-10-25 MED ORDER — PROPOFOL 10 MG/ML IV BOLUS
INTRAVENOUS | Status: DC | PRN
  Administered 2023-10-25: 30 mg via INTRAVENOUS
  Administered 2023-10-25: 50 mg via INTRAVENOUS
  Administered 2023-10-25: 200 mg via INTRAVENOUS
  Administered 2023-10-25: 30 mg via INTRAVENOUS

## 2023-10-25 MED ORDER — CHLORHEXIDINE GLUCONATE 0.12 % MT SOLN
OROMUCOSAL | Status: AC
  Filled 2023-10-25: qty 15

## 2023-10-25 MED ORDER — LIDOCAINE HCL (PF) 2 % IJ SOLN
INTRAMUSCULAR | Status: AC
  Filled 2023-10-25: qty 5

## 2023-10-25 MED ORDER — CHLORHEXIDINE GLUCONATE 0.12 % MT SOLN
15.0000 mL | Freq: Once | OROMUCOSAL | Status: AC
  Administered 2023-10-25: 15 mL via OROMUCOSAL

## 2023-10-25 MED ORDER — LIDOCAINE HCL (CARDIAC) PF 100 MG/5ML IV SOSY
PREFILLED_SYRINGE | INTRAVENOUS | Status: DC | PRN
  Administered 2023-10-25: 100 mg via INTRAVENOUS

## 2023-10-25 MED ORDER — KETOROLAC TROMETHAMINE 30 MG/ML IJ SOLN
INTRAMUSCULAR | Status: DC | PRN
  Administered 2023-10-25: 30 mg via INTRAVENOUS

## 2023-10-25 MED ORDER — SUGAMMADEX SODIUM 200 MG/2ML IV SOLN
INTRAVENOUS | Status: DC | PRN
  Administered 2023-10-25: 190.6 mg via INTRAVENOUS

## 2023-10-25 MED ORDER — OXYCODONE HCL 5 MG PO TABS
ORAL_TABLET | ORAL | Status: AC
  Filled 2023-10-25: qty 1

## 2023-10-25 SURGICAL SUPPLY — 59 items
ADH SKN CLS APL DERMABOND .7 (GAUZE/BANDAGES/DRESSINGS) ×1
APL PRP STRL LF DISP 70% ISPRP (MISCELLANEOUS)
BAG PRESSURE INF REUSE 1000 (BAG) IMPLANT
BLADE SURG 15 STRL LF DISP TIS (BLADE) ×1 IMPLANT
BLADE SURG 15 STRL SS (BLADE) ×1
CANNULA CAP OBTURATR AIRSEAL 8 (CAP) IMPLANT
CAUTERY HOOK MNPLR 1.6 DVNC XI (INSTRUMENTS) ×1 IMPLANT
CHLORAPREP W/TINT 26 (MISCELLANEOUS) ×1 IMPLANT
CLIP LIGATING HEMO O LOK GREEN (MISCELLANEOUS) ×1 IMPLANT
DERMABOND ADVANCED .7 DNX12 (GAUZE/BANDAGES/DRESSINGS) ×1 IMPLANT
DRAPE ARM DVNC X/XI (DISPOSABLE) ×4 IMPLANT
DRAPE COLUMN DVNC XI (DISPOSABLE) ×1 IMPLANT
DRAPE LAPAROTOMY 77X122 PED (DRAPES) ×1 IMPLANT
ELECT CAUTERY BLADE TIP 2.5 (TIP) ×1
ELECT REM PT RETURN 9FT ADLT (ELECTROSURGICAL) ×1
ELECTRODE CAUTERY BLDE TIP 2.5 (TIP) ×1 IMPLANT
ELECTRODE REM PT RTRN 9FT ADLT (ELECTROSURGICAL) ×1 IMPLANT
FORCEPS BPLR R/ABLATION 8 DVNC (INSTRUMENTS) ×1 IMPLANT
FORCEPS PROGRASP DVNC XI (FORCEP) ×1 IMPLANT
GAUZE 4X4 16PLY ~~LOC~~+RFID DBL (SPONGE) ×1 IMPLANT
GLOVE SURG SYN 7.0 (GLOVE) ×5 IMPLANT
GLOVE SURG SYN 7.0 PF PI (GLOVE) ×2 IMPLANT
GLOVE SURG SYN 7.5 E (GLOVE) ×5 IMPLANT
GLOVE SURG SYN 7.5 PF PI (GLOVE) ×2 IMPLANT
GOWN STRL REUS W/ TWL LRG LVL3 (GOWN DISPOSABLE) ×4 IMPLANT
GOWN STRL REUS W/TWL LRG LVL3 (GOWN DISPOSABLE) ×5
IRRIGATOR SUCT 8 DISP DVNC XI (IRRIGATION / IRRIGATOR) IMPLANT
IV NS 1000ML (IV SOLUTION)
IV NS 1000ML BAXH (IV SOLUTION) IMPLANT
KIT PINK PAD W/HEAD ARE REST (MISCELLANEOUS) ×1
KIT PINK PAD W/HEAD ARM REST (MISCELLANEOUS) ×1 IMPLANT
LABEL OR SOLS (LABEL) ×1 IMPLANT
MANIFOLD NEPTUNE II (INSTRUMENTS) ×1 IMPLANT
NDL HYPO 22X1.5 SAFETY MO (MISCELLANEOUS) ×1 IMPLANT
NEEDLE HYPO 22X1.5 SAFETY MO (MISCELLANEOUS) ×1 IMPLANT
NS IRRIG 500ML POUR BTL (IV SOLUTION) ×1 IMPLANT
OBTURATOR OPTICAL STND 8 DVNC (TROCAR) ×1
OBTURATOR OPTICALSTD 8 DVNC (TROCAR) ×1 IMPLANT
PACK BASIN MINOR ARMC (MISCELLANEOUS) ×1 IMPLANT
PACK LAP CHOLECYSTECTOMY (MISCELLANEOUS) ×1 IMPLANT
PENCIL SMOKE EVACUATOR (MISCELLANEOUS) ×1 IMPLANT
SEAL UNIV 5-12 XI (MISCELLANEOUS) ×4 IMPLANT
SET TUBE FILTERED XL AIRSEAL (SET/KITS/TRAYS/PACK) IMPLANT
SET TUBE SMOKE EVAC HIGH FLOW (TUBING) ×1 IMPLANT
SOL ELECTROSURG ANTI STICK (MISCELLANEOUS) ×1
SOLUTION ELECTROSURG ANTI STCK (MISCELLANEOUS) ×1 IMPLANT
SPIKE FLUID TRANSFER (MISCELLANEOUS) ×1 IMPLANT
SUT ETHIBOND 0 MO6 C/R (SUTURE) ×1 IMPLANT
SUT MNCRL AB 4-0 PS2 18 (SUTURE) ×1 IMPLANT
SUT VIC AB 2-0 SH 27 (SUTURE) ×1
SUT VIC AB 2-0 SH 27XBRD (SUTURE) ×1 IMPLANT
SUT VIC AB 3-0 SH 27 (SUTURE) ×1
SUT VIC AB 3-0 SH 27X BRD (SUTURE) ×1 IMPLANT
SUT VICRYL 0 UR6 27IN ABS (SUTURE) ×2 IMPLANT
SYR 20ML LL LF (SYRINGE) ×1 IMPLANT
SYS BAG RETRIEVAL 10MM (BASKET) ×1
SYSTEM BAG RETRIEVAL 10MM (BASKET) ×1 IMPLANT
TRAP FLUID SMOKE EVACUATOR (MISCELLANEOUS) ×1 IMPLANT
WATER STERILE IRR 500ML POUR (IV SOLUTION) ×1 IMPLANT

## 2023-10-25 NOTE — Op Note (Signed)
  Procedure Date:  10/25/2023  Pre-operative Diagnosis:  Symptomatic cholelithiasis, umbilical hernia  Post-operative Diagnosis: Symptomatic cholelithiasis, umbilical hernia 1 cm reducible  Procedure:  Robotic assisted cholecystectomy with ICG FireFly cholangiogram; open umbilical hernia repair  Surgeon:  Howie Ill, MD  Anesthesia:  General endotracheal  Estimated Blood Loss:  10 ml  Specimens:  gallbladder  Complications:  None  Indications for Procedure:  This is a 57 y.o. male who presents with abdominal pain and workup revealing symptomatic cholelithiasis.  He also has an umbilical hernia seen on imaging.  The benefits, complications, treatment options, and expected outcomes were discussed with the patient. The risks of bleeding, infection, recurrence of symptoms, failure to resolve symptoms, bile duct damage, bile duct leak, retained common bile duct stone, bowel injury, and need for further procedures were all discussed with the patient and he was willing to proceed.  Description of Procedure: The patient was correctly identified in the preoperative area and brought into the operating room.  The patient was placed supine with VTE prophylaxis in place.  Appropriate time-outs were performed.  Anesthesia was induced and the patient was intubated.  Appropriate antibiotics were infused.  The abdomen was prepped and draped in a sterile fashion. An infraumbilical incision was made. Cautery was used to dissect along the umbilical stalk and to separate the stalk.  This revealed a 1 cm hernia defect.  This was extended and a 12 mm robotic port was inserted.  Pneumoperitoneum was obtained with appropriate opening pressures.  Three 8-mm ports were placed in the mid abdomen at the level of the umbilicus under direct visualization.  The DaVinci platform was docked, camera targeted, and instruments were placed under direct visualization.  The gallbladder was identified.  The fundus was grasped  and retracted cephalad.  Adhesions were lysed bluntly and with electrocautery. The infundibulum was grasped and retracted laterally, exposing the peritoneum overlying the gallbladder.  This was incised with electrocautery and extended on either side of the gallbladder.  FireFly cholangiogram was then obtained, and we were able to clearly identify the cystic duct and common bile duct.  The cystic duct and cystic artery were carefully dissected with combination of cautery and blunt dissection.  Both were clipped twice proximally and once distally, cutting in between.  The gallbladder was taken from the gallbladder fossa in a retrograde fashion with electrocautery. The gallbladder was placed in an Endocatch bag. The liver bed was inspected and any bleeding was controlled with electrocautery. The right upper quadrant was then inspected again revealing intact clips, no bleeding, and no ductal injury.    The 8 mm ports were removed under direct visualization and the 12 mm port was removed.  The Endocatch bag was brought out via the umbilical incision. The hernia defect was closed using 0 vicryl suture.  Local anesthetic was infused in all incisions.  The stalk was reattached using 2-0 Vicryl, and the umbilical incision was closed with 3-0 Vicryl and 4-0 Monocryl.  The other port incisions were closed with 4-0 Monocryl.  The wounds were cleaned and sealed with DermaBond.  The patient was emerged from anesthesia and extubated and brought to the recovery room for further management.  The patient tolerated the procedure well and all counts were correct at the end of the case.   Howie Ill, MD

## 2023-10-25 NOTE — Interval H&P Note (Signed)
History and Physical Interval Note:  10/25/2023 10:06 AM  Leodis Sias.  has presented today for surgery, with the diagnosis of symptomatic cholelithiasis  reducible umbilical hernia less 3 cm.  The various methods of treatment have been discussed with the patient and family. After consideration of risks, benefits and other options for treatment, the patient has consented to  Procedure(s): XI ROBOTIC ASSISTED LAPAROSCOPIC CHOLECYSTECTOMY (N/A) INDOCYANINE GREEN FLUORESCENCE IMAGING (ICG) (N/A) HERNIA REPAIR UMBILICAL ADULT, open (N/A) as a surgical intervention.  The patient's history has been reviewed, patient examined, no change in status, stable for surgery.  I have reviewed the patient's chart and labs.  Questions were answered to the patient's satisfaction.     Anayah Arvanitis

## 2023-10-25 NOTE — Anesthesia Preprocedure Evaluation (Signed)
Anesthesia Evaluation  Patient identified by MRN, date of birth, ID band Patient awake    Reviewed: Allergy & Precautions, NPO status , Patient's Chart, lab work & pertinent test results  History of Anesthesia Complications Negative for: history of anesthetic complications  Airway Mallampati: IV   Neck ROM: Full    Dental  (+) Missing, Dental Advidsory Given   Pulmonary neg shortness of breath, sleep apnea and Continuous Positive Airway Pressure Ventilation , neg COPD, neg recent URI, Current Smoker and Patient abstained from smoking.   Pulmonary exam normal breath sounds clear to auscultation       Cardiovascular Exercise Tolerance: Good hypertension, (-) angina (-) Past MI and (-) Cardiac Stents Normal cardiovascular exam(-) dysrhythmias (-) Valvular Problems/Murmurs Rhythm:Regular Rate:Normal     Neuro/Psych  PSYCHIATRIC DISORDERS Anxiety     Chronic shoulder pain; marijuana use negative neurological ROS     GI/Hepatic ,GERD  ,,  Endo/Other  neg diabetes  Obesity   Renal/GU Renal disease (nephrolithiasis)     Musculoskeletal  (+) Arthritis ,    Abdominal   Peds  Hematology negative hematology ROS (+)   Anesthesia Other Findings Past Medical History: No date: Anxiety No date: Arthritis No date: Chronic kidney disease     Comment:  kidney stones since age 57 No date: GERD (gastroesophageal reflux disease)     Comment:  with tomatoe products No date: Hypercholesteremia No date: Hypertension No date: Sleep apnea     Comment:  cpap bring machine tested 10-12 yrs ago   Reproductive/Obstetrics                             Anesthesia Physical Anesthesia Plan  ASA: 2  Anesthesia Plan: General   Post-op Pain Management:    Induction: Intravenous  PONV Risk Score and Plan: 0 and Treatment may vary due to age or medical condition, Ondansetron, Dexamethasone and Midazolam  Airway  Management Planned: Oral ETT  Additional Equipment:   Intra-op Plan:   Post-operative Plan: Extubation in OR  Informed Consent: I have reviewed the patients History and Physical, chart, labs and discussed the procedure including the risks, benefits and alternatives for the proposed anesthesia with the patient or authorized representative who has indicated his/her understanding and acceptance.     Dental advisory given  Plan Discussed with: CRNA  Anesthesia Plan Comments:        Anesthesia Quick Evaluation

## 2023-10-25 NOTE — Anesthesia Postprocedure Evaluation (Signed)
Anesthesia Post Note  Patient: Nicholas Greene.  Procedure(s) Performed: XI ROBOTIC ASSISTED LAPAROSCOPIC CHOLECYSTECTOMY INDOCYANINE GREEN FLUORESCENCE IMAGING (ICG) HERNIA REPAIR UMBILICAL ADULT, open  Patient location during evaluation: PACU Anesthesia Type: General Level of consciousness: awake and alert, oriented and patient cooperative Pain management: pain level controlled Vital Signs Assessment: post-procedure vital signs reviewed and stable Respiratory status: spontaneous breathing, nonlabored ventilation and respiratory function stable Cardiovascular status: blood pressure returned to baseline and stable Postop Assessment: adequate PO intake Anesthetic complications: no   No notable events documented.   Last Vitals:  Vitals:   10/25/23 1238 10/25/23 1300  BP: 128/89 119/81  Pulse: 98 87  Resp: 17 14  Temp: 36.5 C 36.6 C  SpO2: 97% 96%    Last Pain:  Vitals:   10/25/23 1300  TempSrc:   PainSc: 7                  Reed Breech

## 2023-10-25 NOTE — Anesthesia Procedure Notes (Signed)
Procedure Name: Intubation Date/Time: 10/25/2023 11:10 AM  Performed by: Emeterio Reeve, CRNAPre-anesthesia Checklist: Patient identified, Emergency Drugs available, Suction available and Patient being monitored Patient Re-evaluated:Patient Re-evaluated prior to induction Oxygen Delivery Method: Circle system utilized Preoxygenation: Pre-oxygenation with 100% oxygen Induction Type: IV induction Ventilation: Mask ventilation without difficulty Laryngoscope Size: McGraph and 4 Grade View: Grade II Tube type: Oral Tube size: 7.5 mm Number of attempts: 1 Airway Equipment and Method: Stylet and Oral airway Placement Confirmation: ETT inserted through vocal cords under direct vision, positive ETCO2 and breath sounds checked- equal and bilateral Secured at: 24 cm Tube secured with: Tape Dental Injury: Teeth and Oropharynx as per pre-operative assessment  Comments: Cords clear; poor view with MAC 4 DL despite cricoid pressure & ramp positioning; poor dentition noted. CA

## 2023-10-25 NOTE — Discharge Instructions (Signed)
Discharge Instructions: 1.  Patient may shower, but do not scrub wounds heavily and dab dry only. 2.  Do not submerge wounds in pool/tub until fully healed. 3.  Do not apply ointments or hydrogen peroxide to the wounds. 4.  May apply ice packs to the wounds for comfort. 5.  Please wear the abdominal binder for the next 4 weeks as much as possible.  May remove for showers.  If possible, would also recommend wearing it while sleeping. 6.  Do not drive while taking narcotics for pain control.  Prior to driving, make sure you are able to rotate right and left to look at blindspots without significant pain or discomfort. 7.  No heavy lifting or pushing of more than 10-15 lbs for 6 weeks.

## 2023-10-25 NOTE — Transfer of Care (Signed)
Immediate Anesthesia Transfer of Care Note  Patient: Nicholas Greene  Procedure(s) Performed: XI ROBOTIC ASSISTED LAPAROSCOPIC CHOLECYSTECTOMY INDOCYANINE GREEN FLUORESCENCE IMAGING (ICG) HERNIA REPAIR UMBILICAL ADULT, open  Patient Location: PACU  Anesthesia Type:General  Level of Consciousness: awake, alert , and oriented  Airway & Oxygen Therapy: Patient Spontanous Breathing and Patient connected to face mask oxygen  Post-op Assessment: Report given to RN and Post -op Vital signs reviewed and stable  Post vital signs: stable  Last Vitals:  Vitals Value Taken Time  BP 128/89 10/25/23 1238  Temp    Pulse 99 10/25/23 1240  Resp 19 10/25/23 1240  SpO2 99 % 10/25/23 1240  Vitals shown include unfiled device data.  Last Pain:  Vitals:   10/25/23 0925  TempSrc: Temporal  PainSc: 4          Complications: No notable events documented.

## 2023-10-26 ENCOUNTER — Ambulatory Visit: Payer: Self-pay | Admitting: General Surgery

## 2023-10-26 LAB — SURGICAL PATHOLOGY

## 2023-11-08 ENCOUNTER — Ambulatory Visit (INDEPENDENT_AMBULATORY_CARE_PROVIDER_SITE_OTHER): Payer: BC Managed Care – PPO | Admitting: Surgery

## 2023-11-08 ENCOUNTER — Encounter: Payer: Self-pay | Admitting: Surgery

## 2023-11-08 VITALS — BP 128/77 | HR 64 | Temp 98.0°F | Ht 66.0 in | Wt 207.0 lb

## 2023-11-08 DIAGNOSIS — Z09 Encounter for follow-up examination after completed treatment for conditions other than malignant neoplasm: Secondary | ICD-10-CM

## 2023-11-08 DIAGNOSIS — K429 Umbilical hernia without obstruction or gangrene: Secondary | ICD-10-CM

## 2023-11-08 DIAGNOSIS — K8020 Calculus of gallbladder without cholecystitis without obstruction: Secondary | ICD-10-CM

## 2023-11-08 NOTE — Progress Notes (Signed)
11/08/2023  HPI: Nicholas Greene. is a 57 y.o. male s/p robotic assisted cholecystectomy with open umbilical hernia repair on 10/25/23.  Patient presents for follow up.  He's been doing well.  Has been having some soreness particularly at the umbilicus, but this has been improving.  He's tolerating a diet and is slowly introducing and testing different foods.  He did have issues with kidney stone last week, but has since resolved.  Vital signs: BP 128/77   Pulse 64   Temp 98 F (36.7 C)   Ht 5\' 6"  (1.676 m)   Wt 207 lb (93.9 kg)   SpO2 97%   BMI 33.41 kg/m    Physical Exam: Constitutional:  No acute distress Abdomen:  Soft, non-distended, with appropriate soreness to palpation.  Incisions are healing well and are clean, dry, intact.  No evidence of hernia recurrence at the umbilicus  Assessment/Plan: This is a 57 y.o. male s/p robotic cholecystectomy and open umbilical hernia repair.  --Patient is healing well from surgery, without evidence of hernia recurrence.  Pain is improving, and he's adding/advancing his diet. --Reviewed with him activity restrictions --Follow up as needed or if any concerns.   Nicholas Ill, MD [facility]

## 2023-11-08 NOTE — Patient Instructions (Signed)
